# Patient Record
Sex: Female | Born: 1990 | Race: White | Hispanic: No | Marital: Single | Smoking: Former smoker
Health system: Southern US, Community
[De-identification: ages and names within clinical notes are randomized; demographics above are authoritative.]

## PROBLEM LIST (undated history)

## (undated) DIAGNOSIS — A64 Unspecified sexually transmitted disease: Secondary | ICD-10-CM

## (undated) DIAGNOSIS — G4733 Obstructive sleep apnea (adult) (pediatric): Secondary | ICD-10-CM

## (undated) DIAGNOSIS — F329 Major depressive disorder, single episode, unspecified: Secondary | ICD-10-CM

## (undated) DIAGNOSIS — IMO0002 Reserved for concepts with insufficient information to code with codable children: Secondary | ICD-10-CM

## (undated) DIAGNOSIS — B009 Herpesviral infection, unspecified: Secondary | ICD-10-CM

## (undated) DIAGNOSIS — F32A Depression, unspecified: Secondary | ICD-10-CM

## (undated) HISTORY — DX: Depression, unspecified: F32.A

## (undated) HISTORY — PX: OTHER SURGICAL HISTORY: SHX169

## (undated) HISTORY — DX: Reserved for concepts with insufficient information to code with codable children: IMO0002

## (undated) HISTORY — DX: Herpesviral infection, unspecified: B00.9

## (undated) HISTORY — DX: Unspecified sexually transmitted disease: A64

## (undated) HISTORY — PX: WISDOM TOOTH EXTRACTION: SHX21

## (undated) HISTORY — DX: Major depressive disorder, single episode, unspecified: F32.9

## (undated) HISTORY — DX: Obstructive sleep apnea (adult) (pediatric): G47.33

---
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
## 2008-03-30 DIAGNOSIS — B009 Herpesviral infection, unspecified: Secondary | ICD-10-CM

## 2008-03-30 HISTORY — DX: Herpesviral infection, unspecified: B00.9

## 2008-04-17 ENCOUNTER — Encounter: Admission: RE | Admit: 2008-04-17 | Discharge: 2008-04-17 | Payer: Self-pay | Admitting: Obstetrics and Gynecology

## 2008-04-21 ENCOUNTER — Other Ambulatory Visit: Admission: RE | Admit: 2008-04-21 | Discharge: 2008-04-21 | Payer: Self-pay | Admitting: Obstetrics and Gynecology

## 2008-12-10 ENCOUNTER — Other Ambulatory Visit: Admission: RE | Admit: 2008-12-10 | Discharge: 2008-12-10 | Payer: Self-pay | Admitting: Family Medicine

## 2009-04-27 DIAGNOSIS — IMO0002 Reserved for concepts with insufficient information to code with codable children: Secondary | ICD-10-CM

## 2009-04-27 HISTORY — DX: Reserved for concepts with insufficient information to code with codable children: IMO0002

## 2009-07-19 ENCOUNTER — Encounter: Admission: RE | Admit: 2009-07-19 | Discharge: 2009-07-19 | Payer: Self-pay | Admitting: Obstetrics and Gynecology

## 2010-05-29 DIAGNOSIS — IMO0002 Reserved for concepts with insufficient information to code with codable children: Secondary | ICD-10-CM

## 2010-05-29 DIAGNOSIS — R87612 Low grade squamous intraepithelial lesion on cytologic smear of cervix (LGSIL): Secondary | ICD-10-CM

## 2010-05-29 HISTORY — DX: Low grade squamous intraepithelial lesion on cytologic smear of cervix (LGSIL): R87.612

## 2010-05-29 HISTORY — DX: Reserved for concepts with insufficient information to code with codable children: IMO0002

## 2010-08-08 ENCOUNTER — Encounter: Payer: Self-pay | Admitting: Family Medicine

## 2010-08-28 HISTORY — PX: COLPOSCOPY: SHX161

## 2012-04-22 ENCOUNTER — Other Ambulatory Visit: Payer: Self-pay | Admitting: Internal Medicine

## 2012-04-22 DIAGNOSIS — B1920 Unspecified viral hepatitis C without hepatic coma: Secondary | ICD-10-CM

## 2012-04-30 ENCOUNTER — Other Ambulatory Visit: Payer: Self-pay

## 2012-05-06 ENCOUNTER — Other Ambulatory Visit: Payer: Self-pay

## 2012-07-23 ENCOUNTER — Encounter: Payer: Self-pay | Admitting: Certified Nurse Midwife

## 2012-07-23 DIAGNOSIS — F329 Major depressive disorder, single episode, unspecified: Secondary | ICD-10-CM | POA: Insufficient documentation

## 2012-07-23 DIAGNOSIS — F32A Depression, unspecified: Secondary | ICD-10-CM | POA: Insufficient documentation

## 2012-07-26 ENCOUNTER — Encounter: Payer: Self-pay | Admitting: Gynecology

## 2012-07-26 ENCOUNTER — Ambulatory Visit (INDEPENDENT_AMBULATORY_CARE_PROVIDER_SITE_OTHER): Payer: 59 | Admitting: Gynecology

## 2012-07-26 VITALS — BP 100/60 | Ht 70.0 in | Wt 170.0 lb

## 2012-07-26 DIAGNOSIS — Z87410 Personal history of cervical dysplasia: Secondary | ICD-10-CM | POA: Insufficient documentation

## 2012-07-26 DIAGNOSIS — Z309 Encounter for contraceptive management, unspecified: Secondary | ICD-10-CM

## 2012-07-26 DIAGNOSIS — Z113 Encounter for screening for infections with a predominantly sexual mode of transmission: Secondary | ICD-10-CM

## 2012-07-26 DIAGNOSIS — Z124 Encounter for screening for malignant neoplasm of cervix: Secondary | ICD-10-CM

## 2012-07-26 DIAGNOSIS — Z0000 Encounter for general adult medical examination without abnormal findings: Secondary | ICD-10-CM

## 2012-07-26 DIAGNOSIS — Z87898 Personal history of other specified conditions: Secondary | ICD-10-CM

## 2012-07-26 DIAGNOSIS — Z8742 Personal history of other diseases of the female genital tract: Secondary | ICD-10-CM

## 2012-07-26 DIAGNOSIS — Z01419 Encounter for gynecological examination (general) (routine) without abnormal findings: Secondary | ICD-10-CM

## 2012-07-26 LAB — POCT URINALYSIS DIPSTICK
Urobilinogen, UA: NEGATIVE
pH, UA: 5

## 2012-07-26 MED ORDER — NORETHINDRONE ACET-ETHINYL EST 1.5-30 MG-MCG PO TABS: 1.0000 | ORAL_TABLET | Freq: Every day | ORAL | Status: DC

## 2012-07-26 NOTE — Progress Notes (Signed)
22 y.o.   Single    Caucasian   female   G0P0000   here for annual exam.  Not currently sexually active, broke up with long time boyfriend, had quick relation after, condoms. Wants cheaper oc.  Gardisil completed  LMP: 06/29/12    Sexually active: yes  The current method of family planning is NuvaRing  inserts.    Exercising:  Cardio 3-4x/wk Last mammogram:  none Last pap smear: 12/27/11  History of abnormal pap: ASCUS + HPV 12/27/11 Smoking: 3 cigs/qd Alcohol: 5 drinks/wk Last colonoscopy: none Last Bone Density:  none Last tetanus shot: 2004 Last cholesterol check: not sure BSE: yes  Hgb:                Urine: Leuks 1   No family history on file.  Patient Active Problem List   Diagnosis Date Noted  . Depression     Past Medical History  Diagnosis Date  . 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 Depression     Past Surgical History  Procedure Laterality Date  . Colposcopy  7/12    CIN I    Allergies: Review of patient's allergies indicates no known allergies.  Current Outpatient Prescriptions  Medication Sig Dispense Refill  . etonogestrel-ethinyl estradiol (NUVARING) 0.12-0.015 MG/24HR vaginal ring Place 1 each vaginally every 28 (twenty-eight) days. Insert vaginally and leave in place for 3 consecutive weeks, then remove for 1 week.      . Multiple Vitamins-Minerals (MULTIVITAMIN PO) Take by mouth.       No current facility-administered medications for this visit.    ROS: Pertinent items are noted in HPI.  Social Hx:    Exam:    BP 100/60   Wt Readings from Last 3 Encounters:  No data found for Wt     Ht Readings from Last 3 Encounters:  No data found for Ht    General appearance: alert, cooperative and appears stated age Head: Normocephalic, without obvious abnormality, atraumatic Neck: no adenopathy, supple, symmetrical, trachea midline and thyroid not  enlarged, symmetric, no tenderness/mass/nodules Lungs: clear to auscultation bilaterally Breasts: Inspection negative, No nipple retraction or dimpling, No nipple discharge or bleeding, No axillary or supraclavicular adenopathy, Normal to palpation without dominant masses Heart: regular rate and rhythm Abdomen: soft, non-tender; bowel sounds normal; no masses,  no organomegaly Extremities: extremities normal, atraumatic, no cyanosis or edema Skin: Skin color, texture, turgor normal. No rashes or lesions Lymph nodes: Cervical, supraclavicular, and axillary nodes normal. No abnormal inguinal nodes palpated Neurologic: Grossly normal   Pelvic: External genitalia:  no lesions              Urethra:  normal appearing urethra with no masses, tenderness or lesions              Bartholins and Skenes: normal                 Vagina: normal appearing vagina with normal color and discharge, no lesions              Cervix: normal appearance              Pap taken: yes        Bimanual Exam:  Uterus:  uterus is normal size, shape, consistency and nontender  Adnexa: normal adnexa in size, nontender and no masses                                      Rectovaginal: Confirms                                      Anus:  normal sphincter tone, no lesions  A: normal exam History of abnormal PAP     P: pap smear with hpv counseled on use and side effects of OCP's, discussed possible decreased bone formation with lower dose oc- will change to Loestrin 1.5/30, if equal cost, prefers switch back GC/CTM only requested condoms return annually or prn     An After Visit Summary was printed and given to the patient.

## 2012-07-29 ENCOUNTER — Telehealth: Payer: Self-pay | Admitting: *Deleted

## 2012-07-29 LAB — GC/CHLAMYDIA PROBE AMP, URINE
Chlamydia, Swab/Urine, PCR: NEGATIVE
GC Probe Amp, Urine: NEGATIVE

## 2012-07-29 NOTE — Telephone Encounter (Signed)
Left Message To Call Back  

## 2012-07-29 NOTE — Telephone Encounter (Signed)
Message copied by Lorraine Lax on Mon Jul 29, 2012  6:29 PM ------      Message from: Douglass Rivers      Created: Mon Jul 29, 2012  4:17 PM       Negative       ------

## 2012-07-31 LAB — IPS PAP TEST WITH HPV

## 2012-07-31 NOTE — Telephone Encounter (Signed)
Returning phone call. Will be in class at 12:15. Call at 2:30

## 2012-07-31 NOTE — Telephone Encounter (Signed)
Left Message To Call Back  

## 2012-08-06 NOTE — Telephone Encounter (Signed)
Pt. Notified 08/05/12 @ 11:31

## 2012-10-07 ENCOUNTER — Other Ambulatory Visit: Payer: Self-pay | Admitting: Gynecology

## 2012-10-07 ENCOUNTER — Telehealth: Payer: Self-pay | Admitting: Gynecology

## 2012-10-07 DIAGNOSIS — Z309 Encounter for contraceptive management, unspecified: Secondary | ICD-10-CM

## 2012-10-07 NOTE — Telephone Encounter (Signed)
Spoke with pt who is interested in a cheaper OCP. Saw TL 07-26-12 for Aex, was on Nuvaring. Switched to OCP due to cost, pt thinks it was Loestrin. Pt reports her sister is on Sprintec and it is very inexpensive. Pt wondering if that could be an option for her. Chart on your desk.

## 2012-10-07 NOTE — Telephone Encounter (Signed)
Cannot say, both are generic, I ordered 3 packs, so maybe that's why it seems expensive, no pharmacy benefits in [place] so can't tell, please have her check her benefits before we start changing pills

## 2012-10-07 NOTE — Telephone Encounter (Signed)
Was in to see Dr. Farrel Gobble at the end of May and wants to talk about birth control options.

## 2012-10-07 NOTE — Telephone Encounter (Signed)
LMTCB  aa 

## 2012-10-08 NOTE — Telephone Encounter (Signed)
Left message on CB# of Dr. Farrel Gobble response to change of OCP due to cost. Left message of need for patient to check her pharmacy benefits first to see what would benefit her of cost of OCP and let us know before changing Rx.

## 2012-10-11 NOTE — Telephone Encounter (Signed)
Left message on CB#VM of need to call our office concerning Rx.

## 2012-10-14 NOTE — Telephone Encounter (Signed)
Left message on CB# of need to return call concerning Rx of OCP.

## 2012-11-29 ENCOUNTER — Telehealth: Payer: Self-pay | Admitting: Gynecology

## 2012-11-29 ENCOUNTER — Other Ambulatory Visit: Payer: Self-pay | Admitting: Gynecology

## 2012-11-29 DIAGNOSIS — Z309 Encounter for contraceptive management, unspecified: Secondary | ICD-10-CM

## 2012-11-29 MED ORDER — ETONOGESTREL-ETHINYL ESTRADIOL 0.12-0.015 MG/24HR VA RING
VAGINAL_RING | VAGINAL | Status: DC
Start: 2012-11-29 — End: 2013-08-15

## 2012-11-29 NOTE — Telephone Encounter (Signed)
Yes, order dropped

## 2012-11-29 NOTE — Telephone Encounter (Signed)
Patient wants to switch her BC from the pill back to the neuva ring.

## 2012-11-29 NOTE — Telephone Encounter (Signed)
Message left to advise "The Prescription that you have requested was sent to your pharmacy if you have any questions,  return call to Peityn Payton at [phone]."  

## 2012-11-29 NOTE — Telephone Encounter (Signed)
Dr. Farrel Gobble okay to switch patient back to Nuvaring? Order pended.  Last exam 06/2012, previously on Nuvaring.

## 2013-08-01 ENCOUNTER — Ambulatory Visit: Payer: 59 | Admitting: Gynecology

## 2013-08-15 ENCOUNTER — Encounter: Payer: Self-pay | Admitting: Gynecology

## 2013-08-15 ENCOUNTER — Ambulatory Visit (INDEPENDENT_AMBULATORY_CARE_PROVIDER_SITE_OTHER): Payer: 59 | Admitting: Gynecology

## 2013-08-15 VITALS — BP 122/60 | HR 68 | Ht 71.0 in | Wt 194.0 lb

## 2013-08-15 DIAGNOSIS — Z0000 Encounter for general adult medical examination without abnormal findings: Secondary | ICD-10-CM

## 2013-08-15 DIAGNOSIS — Z113 Encounter for screening for infections with a predominantly sexual mode of transmission: Secondary | ICD-10-CM

## 2013-08-15 DIAGNOSIS — Z3041 Encounter for surveillance of contraceptive pills: Secondary | ICD-10-CM

## 2013-08-15 DIAGNOSIS — Z01419 Encounter for gynecological examination (general) (routine) without abnormal findings: Secondary | ICD-10-CM

## 2013-08-15 DIAGNOSIS — N760 Acute vaginitis: Secondary | ICD-10-CM

## 2013-08-15 DIAGNOSIS — Z124 Encounter for screening for malignant neoplasm of cervix: Secondary | ICD-10-CM

## 2013-08-15 LAB — POCT URINALYSIS DIPSTICK
Bilirubin, UA: NEGATIVE
Glucose, UA: NEGATIVE
Ketones, UA: NEGATIVE
Nitrite, UA: NEGATIVE
Protein, UA: NEGATIVE
Urobilinogen, UA: NEGATIVE
pH, UA: 6

## 2013-08-15 LAB — POCT WET PREP (WET MOUNT): Clue Cells Wet Prep Whiff POC: POSITIVE

## 2013-08-15 LAB — HEMOGLOBIN, FINGERSTICK: Hemoglobin, fingerstick: 13.4 g/dL (ref 12.0–16.0)

## 2013-08-15 MED ORDER — ETONOGESTREL-ETHINYL ESTRADIOL 0.12-0.015 MG/24HR VA RING
VAGINAL_RING | VAGINAL | Status: DC
Start: 2013-08-15 — End: 2014-12-09

## 2013-08-15 NOTE — Progress Notes (Signed)
Patient ID: Monica Bridges, female   DOB: 03/13/1990, 23 y.o.   MRN: [id number] 23 y.o. Single Caucasian female   G0P0000 here for annual exam. Pt is currently sexually active.  She reports using condoms on a regular basis.  First sexual activity at 23 years old, unsure number of lifetime partners.     Patient's last menstrual period was 07/16/2013.          Sexually active: yes  The current method of family planning is NuvaRing vaginal inserts and condoms most of the time.    Exercising: yes  Home exercise routine includes yoga and light cardio and strength training about 3 times per week.. Last pap:  07/26/12, ASCUS, pos HR HPV Alcohol:  About 20 drinks per week Tobacco:  1-2 packs per week Drugs:  occassional marijuana Gardisil: yes, completed:  2007 Labs:  HB:  13.4  Urine:  1+ leuks, trace RBC  Health Maintenance  Topic Date Due  . Tetanus/tdap  11/28/2007  . Influenza Vaccine  09/27/2013  . Pap Smear  07/27/2015    No family history on file.  Patient Active Problem List   Diagnosis Date Noted  . History of abnormal Pap smear 07/26/2012  . Depression     Past Medical History  Diagnosis Date  . 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 Depression     Past Surgical History  Procedure Laterality Date  . Colposcopy  7/12    CIN I    Allergies: Review of patient's allergies indicates no known allergies.  Current Outpatient Prescriptions  Medication Sig Dispense Refill  . etonogestrel-ethinyl estradiol (NUVARING) 0.12-0.015 MG/24HR vaginal ring Insert vaginally and leave in place for 4 consecutive weeks, then remove for 3d  1 each  11  . Multiple Vitamins-Minerals (MULTIVITAMIN PO) Take by mouth.       No current facility-administered medications for this visit.    ROS: Pertinent items are noted in HPI.  Exam:    BP 122/60  Pulse 68  Ht 5\' 11"  (1.803 m)  Wt 194 lb  (87.998 kg)  BMI 27.07 kg/m2  LMP 07/16/2013 Weight change: @WEIGHTCHANGE @ Last 3 height recordings:  Ht Readings from Last 3 Encounters:  08/15/13 5\' 11"  (1.803 m)  07/26/12 5\' 10"  (1.778 m)   General appearance: alert, cooperative and appears stated age Head: Normocephalic, without obvious abnormality, atraumatic Neck: no adenopathy, no carotid bruit, no JVD, supple, symmetrical, trachea midline and thyroid not enlarged, symmetric, no tenderness/mass/nodules Lungs: clear to auscultation bilaterally Breasts: normal appearance, no masses or tenderness Heart: regular rate and rhythm, S1, S2 normal, no murmur, click, rub or gallop Abdomen: soft, non-tender; bowel sounds normal; no masses,  no organomegaly Extremities: extremities normal, atraumatic, no cyanosis or edema Skin: Skin color, texture, turgor normal. No rashes or lesions Lymph nodes: Cervical, supraclavicular, and axillary nodes normal. no inguinal nodes palpated Neurologic: Grossly normal   Pelvic: External genitalia:  no lesions              Urethra: normal appearing urethra with no masses, tenderness or lesions              Bartholins and Skenes: Bartholin's, Urethra, Skene's normal                 Vagina: normal appearing vagina with normal color and discharge, no lesions, vaginal discharge - white, frothy and thin  Cervix: normal appearance and friable              Pap taken: yes        Bimanual Exam:  Uterus:  uterus is normal size, shape, consistency and nontender                                      Adnexa:    normal adnexa in size, nontender and no masses                                      Rectovaginal: Confirms                                      Anus:  normal sphincter tone, no lesions  1. Routine gynecological examination  counseled on breast self exam, adequate intake of calcium and vitamin D, diet and exercise return annually or prn Discussed STD prevention, regular condom use.   2.  Laboratory examination ordered as part of a routine general medical examination  - POCT urinalysis dipstick - Hemoglobin, fingerstick  3. Encounter for surveillance of contraceptive pills  - etonogestrel-ethinyl estradiol (NUVARING) 0.12-0.015 MG/24HR vaginal ring; Insert vaginally and leave in place for 4 consecutive weeks, then remove for 3d  Dispense: 3 each; Refill: 3  4. Screening for cervical cancer  - PAP with Reflex to HPV (IPS)  5. Screen for STD (sexually transmitted disease)  - N. gonorrhoea and Chlamydia by PCR (IPS) - RPR - HIV antibody - POCT Wet Prep [employer]([place])  6. Vaginitis wp non-diagnostic - POCT Wet Prep [facility]([place])     An After Visit Summary was printed and given to the patient.

## 2013-08-16 LAB — RPR

## 2013-08-16 LAB — HIV ANTIBODY (ROUTINE TESTING W REFLEX): HIV 1&2 Ab, 4th Generation: NONREACTIVE

## 2013-08-19 LAB — IPS N GONORRHOEA AND CHLAMYDIA BY PCR

## 2013-08-20 ENCOUNTER — Telehealth: Payer: Self-pay | Admitting: *Deleted

## 2013-08-20 NOTE — Telephone Encounter (Signed)
Left Message To Call Back  

## 2013-08-20 NOTE — Telephone Encounter (Signed)
Message copied by Lorraine LaxSHAW, JASMINE J on Wed Aug 20, 2013  1:02 PM ------      Message from: Douglass RiversLATHROP, TRACY      Created: Wed Aug 20, 2013 12:57 PM       Inform GC/CTM both negative ------

## 2013-08-21 LAB — IPS PAP TEST WITH REFLEX TO HPV

## 2013-08-21 NOTE — Telephone Encounter (Signed)
Pt returning call. Pt says she is working all day tomorrow and would like a call back from 12-1 if possible.

## 2013-08-22 ENCOUNTER — Other Ambulatory Visit: Payer: Self-pay | Admitting: Gynecology

## 2013-08-22 DIAGNOSIS — N760 Acute vaginitis: Secondary | ICD-10-CM

## 2013-08-22 DIAGNOSIS — B9689 Other specified bacterial agents as the cause of diseases classified elsewhere: Secondary | ICD-10-CM

## 2013-08-22 DIAGNOSIS — IMO0002 Reserved for concepts with insufficient information to code with codable children: Secondary | ICD-10-CM

## 2013-08-22 MED ORDER — TINIDAZOLE 500 MG PO TABS: 2.0000 g | ORAL_TABLET | Freq: Once | ORAL | Status: DC

## 2013-08-22 NOTE — Telephone Encounter (Signed)
Results given as seen below. Patient agreeable and verbalizes understanding. Patient would like to call on lunch break to scheduled colpo. Patient currently using nuvaring for contraception.  Notes Recorded by Reggy EyeJasmine Shaw, CMA on 08/22/2013 at 9:48 AM Patient notified of GC/CHL results was then [facility] to [facility], RN for papsmear results. ------  Notes Recorded by Bennye Almracy H Lathrop, MD on 08/22/2013 at 9:02 AM Inform ASCUS, +HPV, will need colpo, order dropped, also wet prep non-diagnostic but pap suggestive of BV, would treat due tindamax ------

## 2013-08-22 NOTE — Telephone Encounter (Signed)
Patient notified see result note 

## 2013-08-26 ENCOUNTER — Telehealth: Payer: Self-pay

## 2013-08-26 DIAGNOSIS — B9689 Other specified bacterial agents as the cause of diseases classified elsewhere: Secondary | ICD-10-CM

## 2013-08-26 DIAGNOSIS — N760 Acute vaginitis: Secondary | ICD-10-CM

## 2013-08-26 MED ORDER — TINIDAZOLE 500 MG PO TABS: 2.0000 g | ORAL_TABLET | Freq: Once | ORAL | Status: DC

## 2013-08-26 NOTE — Telephone Encounter (Signed)
Spoke with pt. Pt insisted on the [facility] on the [address] of [address]. Rx sent.  Encounter closed

## 2013-08-26 NOTE — Telephone Encounter (Signed)
Received a fax from Pharm asking to clarify Rx. Spoke with Dr. Farrel GobbleLathrop and was told the Rx has to sent to a local pharm. Will call pt to find out what pharm does she want to use.

## 2013-08-27 ENCOUNTER — Telehealth: Payer: Self-pay

## 2013-08-27 DIAGNOSIS — B9689 Other specified bacterial agents as the cause of diseases classified elsewhere: Secondary | ICD-10-CM

## 2013-08-27 DIAGNOSIS — N760 Acute vaginitis: Secondary | ICD-10-CM

## 2013-08-27 NOTE — Telephone Encounter (Signed)
Spoke with Lowella Dandylare from [facility] informing her that the rx was cancelled and sent to a local pharm on request from Dr. Farrel GobbleLathrop. Spoke with pt yesterday and [facility]   Encounter closed

## 2013-08-28 ENCOUNTER — Telehealth: Payer: Self-pay | Admitting: Gynecology

## 2013-08-28 ENCOUNTER — Telehealth: Payer: Self-pay

## 2013-08-28 NOTE — Progress Notes (Signed)
See phone note from 08-22-13. Encounter closed.

## 2013-08-28 NOTE — Telephone Encounter (Signed)
Left message for patient to call back. Need to go over oop for procedure scheduled 07.07.2015.

## 2013-08-28 NOTE — Telephone Encounter (Signed)
Spoke with patient. Colpo scheduled for 7/7 at 1:00pm with Dr.Lathrop. Patient is currently using Nuvaring for contraception. Patient just finished cycle and placed ring on July 1st. Patient is due to take ring out again on 7/28. Colposcopy pre-procedure instructions given. Motrin instructions given. Motrin=Advil=Ibuprofen Can take 800 mg (Can purchase over the counter, you will need four 200 mg pills) every 8 hours as needed.  Take with food. Make sure to eat a meal before appointment and drink plenty of fluids. Patient verbalized understanding and will call to reschedule if will be on menses or has any concerns regarding pregnancy. Advised will need to cancel within 24 hours or will have $100.00 no show fee placed to account. Patient agreeable and verbalizes understanding.  Routing to provider for final review. Patient agreeable to disposition. Will close encounter

## 2013-08-28 NOTE — Telephone Encounter (Signed)
Left message to call Monica Bridges at [phone].  Patient is aware of pap results and that she needs colpo. Patient was to call to schedule appointment.Patient needs colpo scheduled for ASCUS pap with positive high risk HPV. Patient currently using Nuvaring for contraception.

## 2013-09-01 NOTE — Telephone Encounter (Signed)
Reviewed with Dr. Farrel GobbleLathrop, okay to keep scheduled appointment tomorrow for Colposcopy. Call if bleeding worsens.  Patient agreeable.   Routing to provider for final review. Patient agreeable to disposition. Will close encounter

## 2013-09-01 NOTE — Telephone Encounter (Signed)
Patient is scheduled for colpo tomorrow but is spotting is this okay.

## 2013-09-02 ENCOUNTER — Encounter: Payer: Self-pay | Admitting: Gynecology

## 2013-09-02 ENCOUNTER — Ambulatory Visit (INDEPENDENT_AMBULATORY_CARE_PROVIDER_SITE_OTHER): Payer: 59 | Admitting: Gynecology

## 2013-09-02 VITALS — BP 118/62 | Resp 18 | Ht 71.0 in | Wt 193.0 lb

## 2013-09-02 DIAGNOSIS — R6889 Other general symptoms and signs: Secondary | ICD-10-CM

## 2013-09-02 DIAGNOSIS — IMO0002 Reserved for concepts with insufficient information to code with codable children: Secondary | ICD-10-CM

## 2013-09-02 NOTE — Patient Instructions (Signed)

## 2013-09-02 NOTE — Progress Notes (Addendum)
Patient ID: Monica Bridges, female   DOB: 12/26/1990, 23 y.o.   MRN: [id number]  No chief complaint on file.   HPI Monica Bridges is a 23 y.o. female.  Sexually active, new partner few months, mostly uses condoms HPI  Indications: Pap smear on June 2015 showed: ASCUS with POSITIVE high risk HPV. Previous colposcopy: NONE. Prior cervical treatment: no treatment.Procedure explained and patient's questions were invited and answered.  Consent form signed.    Role of HPV in genesis of SIL discussed with patient, and questions answered.     Past Medical History  Diagnosis Date  . 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 Depression     Past Surgical History  Procedure Laterality Date  . Colposcopy  7/12    CIN I    No family history on file.  Social History History  Substance Use Topics  . Smoking status: Current Every Day Smoker -- 0.25 packs/day    Types: Cigarettes  . Smokeless tobacco: Never Used  . Alcohol Use: 10.0 oz/week    20 drink(s) per week    No Known Allergies  Current Outpatient Prescriptions  Medication Sig Dispense Refill  . etonogestrel-ethinyl estradiol (NUVARING) 0.12-0.015 MG/24HR vaginal ring Insert vaginally and leave in place for 4 consecutive weeks, then remove for 3d  3 each  3  . Multiple Vitamins-Minerals (MULTIVITAMIN PO) Take by mouth.      . tinidazole (TINDAMAX) 500 MG tablet Take 4 tablets (2,000 mg total) by mouth once. 4 tablets at once for 2d  8 tablet  0   No current facility-administered medications for this visit.    Review of Systems Review of Systems  Last menstrual period 07/16/2013.  Physical Exam Physical Exam  Constitutional: She appears well-developed and well-nourished.  Genitourinary: Vagina normal. No vaginal discharge found.      Data Reviewed   Assessment    Procedure Details  The risks and benefits of  the procedure and Written informed consent obtained.  Marland Kitchen.Speculum inserted atraumatically and cervix visualized.  3% acetic acid applied.  Cervix examined using 3.75 and 7.5 and 15   X magnification and green filter.    Gross appearance:normal  Squamocolumnar junction seen in entirety: yes   no mosaicism, no punctation, no abnormal vasculature and acetowhite lesion(s) noted at 1-3, 4 and 8-10 o'clock  cervix swabbed with Lugol's solution, endocervical speculum placed, SCJ visualized - lesion at 2,4,10 o'clock, endocervical lesion noted at none , cervical biopsies taken at 2,4,10 o'clock, specimen labelled and sent to pathology and hemostasis achieved with Monsel's solution  Extent of lesion entirely seen: yes    Specimens: 4,10,2  Complications: none.     Plan    Specimens labelled and sent to Pathology. Triage based on results      Josue Kass H 09/02/2013, 12:35 PM    Patient tolerated procedure well.    Post biopsy instructions and AVS given to patient.    (A.) CERVIX, "4:00", BIOPSY: -BENIGN ENDOCERVICAL MUCOSA -NEGATIVE FOR ATYPIA OR MALIGNANCY -NO SQUAMOUS MUCOSA PRESENT FOR EVALUATION (B.) CERVIX, "10:00", BIOPSY: -BENIGN ENDOCERVICAL MUCOSA WITH CHRONIC INFLAMMATION -NEGATIVE FOR ATYPIA OR MALIGNANCY -NO SQUAMOUS MUCOSA PRESENT FOR EVALUATION (C.) CERVIX, "2:00", BIOPSY: -FOCAL LOW GRADE SQUAMOUS INTRAEPITHELIAL LESION WITH SUPERFICIAL ENDOCERVICAL GLAND INVOLVEMENT  Recall 8

## 2013-09-04 LAB — IPS OTHER TISSUE BIOPSY

## 2014-08-17 ENCOUNTER — Ambulatory Visit: Payer: 59 | Admitting: Gynecology

## 2014-08-24 ENCOUNTER — Telehealth: Payer: Self-pay | Admitting: *Deleted

## 2014-08-24 NOTE — Telephone Encounter (Signed)
Left message for patient to call the office. We have a faxes refill request for her birth control, she is past due for her AEX-needs apt. -eh

## 2014-08-26 ENCOUNTER — Telehealth: Payer: Self-pay | Admitting: *Deleted

## 2014-08-26 DIAGNOSIS — Z3041 Encounter for surveillance of contraceptive pills: Secondary | ICD-10-CM

## 2014-08-26 NOTE — Telephone Encounter (Signed)
[facility] for Nuvaring Last AEX:  08/15/13 with TL Next AEX: NO aex scheduled for 2016 Last MMG (if hormonal medication request): n/a  Refill authorized: ?  Left Message To Call Back

## 2014-08-28 NOTE — Telephone Encounter (Signed)
Called patient and scheduled her for AEX with PG 09/04/14. Asked patient if she needed refill and she said that she wants to hold off on that until she sees Ms. Patty she wants to discuss that with her along with other things when she comes in for AEX.   Rx denied for now.  Routed to provider for review, encounter closed.

## 2014-08-28 NOTE — Telephone Encounter (Signed)
Patient returning call.

## 2014-09-02 ENCOUNTER — Encounter: Payer: Self-pay | Admitting: Nurse Practitioner

## 2014-09-02 ENCOUNTER — Ambulatory Visit (INDEPENDENT_AMBULATORY_CARE_PROVIDER_SITE_OTHER): Payer: 59 | Admitting: Nurse Practitioner

## 2014-09-02 VITALS — BP 96/60 | HR 80 | Resp 16 | Ht 70.0 in | Wt 194.0 lb

## 2014-09-02 DIAGNOSIS — Z113 Encounter for screening for infections with a predominantly sexual mode of transmission: Secondary | ICD-10-CM

## 2014-09-02 DIAGNOSIS — Z01419 Encounter for gynecological examination (general) (routine) without abnormal findings: Secondary | ICD-10-CM | POA: Diagnosis not present

## 2014-09-02 DIAGNOSIS — Z0000 Encounter for general adult medical examination without abnormal findings: Secondary | ICD-10-CM

## 2014-09-02 DIAGNOSIS — Z309 Encounter for contraceptive management, unspecified: Secondary | ICD-10-CM

## 2014-09-02 DIAGNOSIS — Z3009 Encounter for other general counseling and advice on contraception: Secondary | ICD-10-CM

## 2014-09-02 LAB — POCT URINALYSIS DIPSTICK
Bilirubin, UA: NEGATIVE
Glucose, UA: NEGATIVE
Ketones, UA: NEGATIVE
Nitrite, UA: NEGATIVE
Protein, UA: NEGATIVE
Urobilinogen, UA: NEGATIVE
pH, UA: 5

## 2014-09-02 NOTE — Patient Instructions (Signed)
General topics  Next pap or exam is  due in 1 year Take a Women's multivitamin Take 1200 mg. of calcium daily - prefer dietary If any concerns in interim to call back  Breast Self-Awareness Practicing breast self-awareness may pick up problems early, prevent significant medical complications, and possibly save your life. By practicing breast self-awareness, you can become familiar with how your breasts look and feel and if your breasts are changing. This allows you to notice changes early. It can also offer you some reassurance that your breast health is good. One way to learn what is normal for your breasts and whether your breasts are changing is to do a breast self-exam. If you find a lump or something that was not present in the past, it is best to contact your caregiver right away. Other findings that should be evaluated by your caregiver include nipple discharge, especially if it is bloody; skin changes or reddening; areas where the skin seems to be pulled in (retracted); or new lumps and bumps. Breast pain is seldom associated with cancer (malignancy), but should also be evaluated by a caregiver. BREAST SELF-EXAM The best time to examine your breasts is 5 7 days after your menstrual period is over.  ExitCare Patient Information 2013 ExitCare, LLC.   Exercise to Stay Healthy Exercise helps you become and stay healthy. EXERCISE IDEAS AND TIPS Choose exercises that:  You enjoy.  Fit into your day. You do not need to exercise really hard to be healthy. You can do exercises at a slow or medium level and stay healthy. You can:  Stretch before and after working out.  Try yoga, Pilates, or tai chi.  Lift weights.  Walk fast, swim, jog, run, climb stairs, bicycle, dance, or rollerskate.  Take aerobic classes. Exercises that burn about 150 calories:  Running 1  miles in 15 minutes.  Playing volleyball for 45 to 60 minutes.  Washing and waxing a car for 45 to 60  minutes.  Playing touch football for 45 minutes.  Walking 1  miles in 35 minutes.  Pushing a stroller 1  miles in 30 minutes.  Playing basketball for 30 minutes.  Raking leaves for 30 minutes.  Bicycling 5 miles in 30 minutes.  Walking 2 miles in 30 minutes.  Dancing for 30 minutes.  Shoveling snow for 15 minutes.  Swimming laps for 20 minutes.  Walking up stairs for 15 minutes.  Bicycling 4 miles in 15 minutes.  Gardening for 30 to 45 minutes.  Jumping rope for 15 minutes.  Washing windows or floors for 45 to 60 minutes. Document Released: 03/18/2010 Document Revised: 05/08/2011 Document Reviewed: 03/18/2010 ExitCare Patient Information 2013 ExitCare[employer] LLC.   Other topics ( that may be useful information):    Sexually Transmitted Disease Sexually transmitted disease (STD) refers to any infection that is passed from person to person during sexual activity. This may happen by way of saliva, semen, blood, vaginal mucus, or urine. Common STDs include:  Gonorrhea.  Chlamydia.  Syphilis.  HIV/AIDS.  Genital herpes.  Hepatitis B and C.  Trichomonas.  Human papillomavirus (HPV).  Pubic lice. CAUSES  An STD may be spread by bacteria, virus, or parasite. A person can get an STD by:  Sexual intercourse with an infected person.  Sharing sex toys with an infected person.  Sharing needles with an infected person.  Having intimate contact with the genitals, mouth, or rectal areas of an infected person. SYMPTOMS  Some people may not have any symptoms, but   they can still pass the infection to others. Different STDs have different symptoms. Symptoms include:  Painful or bloody urination.  Pain in the pelvis, abdomen, vagina, anus, throat, or eyes.  Skin rash, itching, irritation, growths, or sores (lesions). These usually occur in the genital or anal area.  Abnormal vaginal discharge.  Penile discharge in men.  Soft, flesh-colored skin growths in the  genital or anal area.  Fever.  Pain or bleeding during sexual intercourse.  Swollen glands in the groin area.  Yellow skin and eyes (jaundice). This is seen with hepatitis. DIAGNOSIS  To make a diagnosis, your caregiver may:  Take a medical history.  Perform a physical exam.  Take a specimen (culture) to be examined.  Examine a sample of discharge under a microscope.  Perform blood test TREATMENT   Chlamydia, gonorrhea, trichomonas, and syphilis can be cured with antibiotic medicine.  Genital herpes, hepatitis, and HIV can be treated, but not cured, with prescribed medicines. The medicines will lessen the symptoms.  Genital warts from HPV can be treated with medicine or by freezing, burning (electrocautery), or surgery. Warts may come back.  HPV is a virus and cannot be cured with medicine or surgery.However, abnormal areas may be followed very closely by your caregiver and may be removed from the cervix, vagina, or vulva through office procedures or surgery. If your diagnosis is confirmed, your recent sexual partners need treatment. This is true even if they are symptom-free or have a negative culture or evaluation. They should not have sex until their caregiver says it is okay. HOME CARE INSTRUCTIONS  All sexual partners should be informed, tested, and treated for all STDs.  Take your antibiotics as directed. Finish them even if you start to feel better.  Only take over-the-counter or prescription medicines for pain, discomfort, or fever as directed by your caregiver.  Rest.  Eat a balanced diet and drink enough fluids to keep your urine clear or pale yellow.  Do not have sex until treatment is completed and you have followed up with your caregiver. STDs should be checked after treatment.  Keep all follow-up appointments, Pap tests, and blood tests as directed by your caregiver.  Only use latex condoms and water-soluble lubricants during sexual activity. Do not use  petroleum jelly or oils.  Avoid alcohol and illegal drugs.  Get vaccinated for HPV and hepatitis. If you have not received these vaccines in the past, talk to your caregiver about whether one or both might be right for you.  Avoid risky sex practices that can break the skin. The only way to avoid getting an STD is to avoid all sexual activity.Latex condoms and dental dams (for oral sex) will help lessen the risk of getting an STD, but will not completely eliminate the risk. SEEK MEDICAL CARE IF:   You have a fever.  You have any new or worsening symptoms. Document Released: 05/06/2002 Document Revised: 05/08/2011 Document Reviewed: 05/13/2010 [facility] Patient Information 2013 [place].    Domestic Abuse You are being battered or abused if someone close to you hits, pushes, or physically hurts you in any way. You also are being abused if you are forced into activities. You are being sexually abused if you are forced to have sexual contact of any kind. You are being emotionally abused if you are made to feel worthless or if you are constantly threatened. It is important to remember that help is available. No one has the right to abuse you. PREVENTION OF FURTHER  ABUSE  Learn the warning signs of danger. This varies with situations but may include: the use of alcohol, threats, isolation from friends and family, or forced sexual contact. Leave if you feel that violence is going to occur.  If you are attacked or beaten, report it to the police so the abuse is documented. You do not have to press charges. The police can protect you while you or the attackers are leaving. Get the officer's name and badge number and a copy of the report.  Find someone you can trust and tell them what is happening to you: your caregiver, a nurse, clergy member, close friend or family member. Feeling ashamed is natural, but remember that you have done nothing wrong. No one deserves abuse. Document Released:  02/11/2000 Document Revised: 05/08/2011 Document Reviewed: 04/21/2010 ExitCare Patient Information 2013 ExitCare[employer] LLC.    How Much is Too Much Alcohol? Drinking too much alcohol can cause injury, accidents, and health problems. These types of problems can include:   Car crashes.  Falls.  Family fighting (domestic violence).  Drowning.  Fights.  Injuries.  Burns.  Damage to certain organs.  Having a baby with birth defects. ONE DRINK CAN BE TOO MUCH WHEN YOU ARE:  Working.  Pregnant or breastfeeding.  Taking medicines. Ask your doctor.  Driving or planning to drive. If you or someone you know has a drinking problem, get help from a doctor.  Document Released: 12/10/2008 Document Revised: 05/08/2011 Document Reviewed: 12/10/2008 ExitCare Patient Information 2013 ExitCare[employer] LLC.   Smoking Hazards Smoking cigarettes is extremely bad for your health. Tobacco smoke has over 200 known poisons in it. There are over 60 chemicals in tobacco smoke that cause cancer. Some of the chemicals found in cigarette smoke include:   Cyanide.  Benzene.  Formaldehyde.  Methanol (wood alcohol).  Acetylene (fuel used in welding torches).  Ammonia. Cigarette smoke also contains the poisonous gases nitrogen oxide and carbon monoxide.  Cigarette smokers have an increased risk of many serious medical problems and Smoking causes approximately:  90% of all lung cancer deaths in men.  80% of all lung cancer deaths in women.  90% of deaths from chronic obstructive lung disease. Compared with nonsmokers, smoking increases the risk of:  Coronary heart disease by 2 to 4 times.  Stroke by 2 to 4 times.  Men developing lung cancer by 23 times.  Women developing lung cancer by 13 times.  Dying from chronic obstructive lung diseases by 12 times.  . Smoking is the most preventable cause of death and disease in our society.  WHY IS SMOKING ADDICTIVE?  Nicotine is the chemical  agent in tobacco that is capable of causing addiction or dependence.  When you smoke and inhale, nicotine is absorbed rapidly into the bloodstream through your lungs. Nicotine absorbed through the lungs is capable of creating a powerful addiction. Both inhaled and non-inhaled nicotine may be addictive.  Addiction studies of cigarettes and spit tobacco show that addiction to nicotine occurs mainly during the teen years, when young people begin using tobacco products. WHAT ARE THE BENEFITS OF QUITTING?  There are many health benefits to quitting smoking.   Likelihood of developing cancer and heart disease decreases. Health improvements are seen almost immediately.  Blood pressure, pulse rate, and breathing patterns start returning to normal soon after quitting. QUITTING SMOKING   American Lung Association - 1-800-LUNGUSA  American Cancer Society - 1-800-ACS-2345 Document Released: 03/23/2004 Document Revised: 05/08/2011 Document Reviewed: 11/25/2008 ExitCare Patient Information 2013 ExitCare,   LLC.   Stress Management Stress is a state of physical or mental tension that often results from changes in your life or normal routine. Some common causes of stress are:  Death of a loved one.  Injuries or severe illnesses.  Getting fired or changing jobs.  Moving into a new home. Other causes may be:  Sexual problems.  Business or financial losses.  Taking on a large debt.  Regular conflict with someone at home or at work.  Constant tiredness from lack of sleep. It is not just bad things that are stressful. It may be stressful to:  Win the lottery.  Get married.  Buy a new car. The amount of stress that can be easily tolerated varies from person to person. Changes generally cause stress, regardless of the types of change. Too much stress can affect your health. It may lead to physical or emotional problems. Too little stress (boredom) may also become stressful. SUGGESTIONS TO  REDUCE STRESS:  Talk things over with your family and friends. It often is helpful to share your concerns and worries. If you feel your problem is serious, you may want to get help from a professional counselor.  Consider your problems one at a time instead of lumping them all together. Trying to take care of everything at once may seem impossible. List all the things you need to do and then start with the most important one. Set a goal to accomplish 2 or 3 things each day. If you expect to do too many in a single day you will naturally fail, causing you to feel even more stressed.  Do not use alcohol or drugs to relieve stress. Although you may feel better for a short time, they do not remove the problems that caused the stress. They can also be habit forming.  Exercise regularly - at least 3 times per week. Physical exercise can help to relieve that "uptight" feeling and will relax you.  The shortest distance between despair and hope is often a good night's sleep.  Go to bed and get up on time allowing yourself time for appointments without being rushed.  Take a short "time-out" period from any stressful situation that occurs during the day. Close your eyes and take some deep breaths. Starting with the muscles in your face, tense them, hold it for a few seconds, then relax. Repeat this with the muscles in your neck, shoulders, hand, stomach, back and legs.  Take good care of yourself. Eat a balanced diet and get plenty of rest.  Schedule time for having fun. Take a break from your daily routine to relax. HOME CARE INSTRUCTIONS   Call if you feel overwhelmed by your problems and feel you can no longer manage them on your own.  Return immediately if you feel like hurting yourself or someone else. Document Released: 08/09/2000 Document Revised: 05/08/2011 Document Reviewed: 04/01/2007 ExitCare Patient Information 2013 ExitCare, LLC.  

## 2014-09-02 NOTE — Progress Notes (Signed)
24 y.o. G0 Single  Caucasian Fe here for annual exam.  Menses last 5-7 days on Nuva Ring.  She really would like another choice of birth control with school coming up.   Not SA in over a year. Still in school for undergrad -then applying this summer for med school - she wants to do Veterinarian medicine.  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  She has noted some vaginal discharge before menses started not sure if she has an infection.  She also wants to be checked for STD's.  Patient's last menstrual period was 08/26/2014 (approximate).          Sexually active: No.  The current method of family planning is Nuva Ring vaginal inserts.    Exercising: Yes.    Biking, walking Smoker:  yes  Health Maintenance: Pap:  08/15/13 ASCUS + HR HPV: detected. Colpo Biopsy CIN1 09/02/2013 TDaP:  2008 Gardasil 2007 Labs: Here today - STD Check UA: WBC=Trace, RBC=Large ( On Menses ) Hg: 13.2   reports that she has been smoking Cigarettes.  She has been smoking about 0.25 packs per day. She has never used smokeless tobacco. She reports that she drinks about 0.6 oz of alcohol per week. She reports that she uses illicit drugs (Marijuana).  Past Medical History  Diagnosis Date  . 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 Depression     Past Surgical History  Procedure Laterality Date  . Colposcopy  7/12    CIN I    Current Outpatient Prescriptions  Medication Sig Dispense Refill  . etonogestrel-ethinyl estradiol (NUVARING) 0.12-0.015 MG/24HR vaginal ring Insert vaginally and leave in place for 4 consecutive weeks, then remove for 3d 3 each 3  . Multiple Vitamins-Minerals (MULTIVITAMIN PO) Take by mouth.     No current facility-administered medications for this visit.    Family History  Problem Relation Age of Onset  . Cancer Maternal Aunt     Lung    ROS:  Pertinent items are noted in HPI.   Otherwise, a comprehensive ROS was negative.  Exam:   BP 96/60 mmHg  Pulse 80  Resp 16  Ht  (1.778 m)  Wt 194 lb (87.998 kg)  BMI 27.84 kg/m2  LMP 08/26/2014 (Approximate) Height:  (177.8 cm) Ht Readings from Last 3 Encounters:  09/02/14  (1.778 m)  09/02/13  (1.803 m)  08/15/13  (1.803 m)    General appearance: alert, cooperative and appears stated age Head: Normocephalic, without obvious abnormality, atraumatic Neck: no adenopathy, supple, symmetrical, trachea midline and thyroid normal to inspection and palpation Lungs: clear to auscultation bilaterally Breasts: normal appearance, no masses or tenderness Heart: regular rate and rhythm Abdomen: soft, non-tender; no masses,  no organomegaly Extremities: extremities normal, atraumatic, no cyanosis or edema Skin: Skin color, texture, turgor normal. No rashes or lesions Lymph nodes: Cervical, supraclavicular, and axillary nodes normal. No abnormal inguinal nodes palpated Neurologic: Grossly normal   Pelvic: External genitalia:  no lesions              Urethra:  normal appearing urethra with no masses, tenderness or lesions              Bartholin's and Skene's: normal                 Vagina: normal appearing vagina with normal color and  discharge, no lesions              Cervix: anteverted              Pap taken: Yes.   Bimanual Exam:  Uterus:  normal size, contour, position, consistency, mobility, non-tender              Adnexa: no mass, fullness, tenderness               Rectovaginal: Confirms               Anus:  normal sphincter tone, no lesions  Chaperone present: Yes  A:  Well Woman with normal exam  Contraception with Nuva Ring  History of ASCUS with HR HPV - colp Bx 09/02/2013 CIN I  Would like to consider Skyla IUD  R/O vaginitis  R/O STD's  P:   Reviewed health and wellness pertinent to exam  Pap smear as above  Will follow with STD's  She has a month of Nuva Ring left and will  hopefully get Skyla placement before then.  [facility] and she will need consult with MD  Counseled on breast self exam, use and side effects of OCP's, adequate intake of calcium and vitamin D, diet and exercise return annually or prn  An After Visit Summary was printed and given to the patient.

## 2014-09-03 ENCOUNTER — Other Ambulatory Visit: Payer: Self-pay | Admitting: Certified Nurse Midwife

## 2014-09-03 ENCOUNTER — Telehealth: Payer: Self-pay | Admitting: *Deleted

## 2014-09-03 DIAGNOSIS — N760 Acute vaginitis: Secondary | ICD-10-CM

## 2014-09-03 DIAGNOSIS — B9689 Other specified bacterial agents as the cause of diseases classified elsewhere: Secondary | ICD-10-CM

## 2014-09-03 LAB — STD PANEL
HIV 1&2 Ab, 4th Generation: NONREACTIVE
Hepatitis B Surface Ag: NEGATIVE

## 2014-09-03 LAB — WET PREP BY MOLECULAR PROBE
Candida species: NEGATIVE
Gardnerella vaginalis: POSITIVE — AB
Trichomonas vaginosis: NEGATIVE

## 2014-09-03 MED ORDER — HYLAFEM VA SUPP: 1.0000 | Freq: Every day | VAGINAL | Status: DC

## 2014-09-03 NOTE — Telephone Encounter (Signed)
Left Message To Call Back  

## 2014-09-03 NOTE — Telephone Encounter (Signed)
-----   Message from Verner Choleborah S Leonard, CNM sent at 09/03/2014 12:30 PM EDT ----- Notify patient that affirm is positive for BV, negative for yeast and Trichomonas [facility] may need [facility] order and that is Ok to wait to treat. Will use one suppository nightly x 3 nights . Symptoms should resolve within 2 weeks. HIV,RPR, Hep B negative GC,Chlamydia pending

## 2014-09-03 NOTE — Telephone Encounter (Signed)
Patient aware of results and to use Hylafem  1 suppository nightly x 3 nights. Aware someone will call her as soon as gc/ct comes in. [facility] is [facility].  Encounter closed.

## 2014-09-03 NOTE — Telephone Encounter (Signed)
Returning call.

## 2014-09-04 LAB — IPS N GONORRHOEA AND CHLAMYDIA BY PCR

## 2014-09-07 LAB — IPS PAP TEST WITH HPV

## 2014-09-07 LAB — HEMOGLOBIN, FINGERSTICK: Hemoglobin, fingerstick: 13.2 g/dL (ref 12.0–16.0)

## 2014-09-07 NOTE — Progress Notes (Signed)
Encounter reviewed by Dr. Tashena Ibach Amundson C. Silva.  

## 2014-09-18 ENCOUNTER — Telehealth: Payer: Self-pay | Admitting: Emergency Medicine

## 2014-09-18 DIAGNOSIS — R87810 Cervical high risk human papillomavirus (HPV) DNA test positive: Secondary | ICD-10-CM

## 2014-09-18 NOTE — Telephone Encounter (Signed)
-----   Message from Ria Comment, FNP sent at 09/16/2014  8:58 AM EDT ----- Dr. Edward Jolly says to proceed with a colpo biopsy - she had one done last year by Dr. Farrel Gobble. Please inform pt and schedule ----- Message -----    From: Patton Salles, MD    Sent: 09/16/2014   8:24 AM      To: Ria Comment, FNP  I recommend proceeding with colposcopy.

## 2014-09-18 NOTE — Telephone Encounter (Signed)
Patient notified of message from  Lauro Franklin, FNP. Marland Kitchen  She is agreeable to scheduling colposcopy.  Brief description of procedure given to patient. Patient remembers from last year procedure with Dr. Farrel Gobble.  Colposcopy pre-procedure instructions given. Discussed menses and need to not have any bleeding on day of appointment, advised to call to reschedule if starts cycle. Patient will have Nuva Ring in place. Not due to take it out until end of month, July.  Make sure to eat a meal and hydrate before appointment.  Advised 800 mg of Motrin with food one hour prior to appointment. Motrin/Advil or Ibuprofen. Take 800 mg (Can purchase over the counter, you will need four 200 mg pills).  Advised will need to cancel or reschedule within 72 hours or will have $150.00 no show fee placed to account.   Patient verbalized understanding of preprocedure instructions and cancellation policy and will call to reschedule if will be on menses or has any concerns regarding pregnancy.  Patient is advised she will be contacted with insurance coverage information.    cc Lilyan Gilford for insurance pre certification. Patient also in precert for [facility] IUD, can you give both when calling? Scheduled for 09/24/14 at 1500 with Dr. Edward Jolly. Arrival at 1445.  Routing to provider for final review. Patient agreeable to disposition. Will close encounter.

## 2014-09-24 ENCOUNTER — Ambulatory Visit (INDEPENDENT_AMBULATORY_CARE_PROVIDER_SITE_OTHER): Payer: 59 | Admitting: Obstetrics and Gynecology

## 2014-09-24 ENCOUNTER — Encounter: Payer: Self-pay | Admitting: Obstetrics and Gynecology

## 2014-09-24 DIAGNOSIS — R87810 Cervical high risk human papillomavirus (HPV) DNA test positive: Secondary | ICD-10-CM | POA: Diagnosis not present

## 2014-09-24 NOTE — Progress Notes (Signed)
Subjective:     Patient ID: Monica Bridges, female   DOB: March 08, 1990, 24 y.o.   MRN: [id number]  HPI  Patient here for colposcopy today due to normal pap but Positive HR HPV on 09-02-14.    Patient has history of pap on 09-02-13 Ascus:Pos HR HPV with colposcopy revealing CIN I.   Review of Systems  LMP: 08-27-14 Contraception: Nuvaring.  Waiting for approval for Skyla.      Objective:   Physical Exam  Genitourinary:     Colposcopy. Consent for procedure.  NuvaRing removed and discarded per patient request.  (Time to take it out.) 3% acetic acid placed in vagina.  Satisfactory colposcopy.  Ectropion noted and rim of acetowhite change around cervix circumferentially.  ECC taken and sent to path. Punctations at 8:00.   Biopsy taken and sent to path. Mosaics at 2:00.  Biopsy taken and sent to path. Monsel's placed.  Minimal EBL.  No complications.     Assessment:     HPV changes.  Positive HR HPV.     Plan:     Discussion of HPV, abnormal paps, and treatment if needed with LEEP. Observational management of moderate dysplasia in young women also discussed. Follow up biopsies.  Instructions and precautions given.  Await Skyla until after biopsy results have returned and plan made for follow up of colposcopy.  _____15__ minutes face to face time of which over 50% was spent in counseling.   After visit summary to patient.

## 2014-09-24 NOTE — Patient Instructions (Signed)

## 2014-09-26 ENCOUNTER — Encounter: Payer: Self-pay | Admitting: Obstetrics and Gynecology

## 2014-09-29 LAB — IPS OTHER TISSUE BIOPSY

## 2014-10-01 ENCOUNTER — Telehealth: Payer: Self-pay | Admitting: Emergency Medicine

## 2014-10-01 NOTE — Telephone Encounter (Signed)
Message left to return call to Cozette Braggs at [phone].   08 Recall placed  

## 2014-10-01 NOTE — Telephone Encounter (Signed)
-----   Message from Patton Salles, MD sent at 09/30/2014  9:06 PM EDT ----- Please report results to patient showing LGSIL of cervix.  No cancer was seen.  Plan for pap in one year.  Recall - 08.  OK to proceed with Skyla IUD.   Cc- Claudette Laws.

## 2014-10-06 NOTE — Telephone Encounter (Signed)
Message left to return call to Sosie Gato at [phone].    

## 2014-10-09 NOTE — Telephone Encounter (Signed)
Based on workque notes, patient is precerted and ok to schedule when ready.

## 2014-10-15 NOTE — Telephone Encounter (Signed)
Message left to return call to Airyana Sprunger at [phone].    

## 2014-10-20 NOTE — Telephone Encounter (Signed)
08 Recall in place for 07/2015.  Patient returned call and message from Dr. Edward Jolly given.  Verbalized understanding of results and very important for follow up in one year for repeat pap smear.  Patient advised to call with first day of her cycle to schedule Skyla IUD insertion. She is agreeable.  Routing to provider for final review. Patient agreeable to disposition. Will close encounter.

## 2014-10-22 ENCOUNTER — Encounter: Payer: Self-pay | Admitting: Neurology

## 2014-10-22 ENCOUNTER — Ambulatory Visit (INDEPENDENT_AMBULATORY_CARE_PROVIDER_SITE_OTHER): Payer: 59 | Admitting: Neurology

## 2014-10-22 VITALS — BP 102/60 | HR 68 | Resp 16 | Ht 70.0 in | Wt 199.0 lb

## 2014-10-22 DIAGNOSIS — Z720 Tobacco use: Secondary | ICD-10-CM | POA: Diagnosis not present

## 2014-10-22 DIAGNOSIS — G4710 Hypersomnia, unspecified: Secondary | ICD-10-CM | POA: Diagnosis not present

## 2014-10-22 DIAGNOSIS — E663 Overweight: Secondary | ICD-10-CM

## 2014-10-22 DIAGNOSIS — R0683 Snoring: Secondary | ICD-10-CM | POA: Diagnosis not present

## 2014-10-22 DIAGNOSIS — G4719 Other hypersomnia: Secondary | ICD-10-CM | POA: Diagnosis not present

## 2014-10-22 DIAGNOSIS — F17200 Nicotine dependence, unspecified, uncomplicated: Secondary | ICD-10-CM

## 2014-10-22 NOTE — Patient Instructions (Signed)
I believe you do have a condition that causes you to be excessively sleepy. We will do further sleep testing, as discussed.  Please remember to try to maintain good sleep hygiene, which means: Keep a regular sleep and wake schedule, try not to exercise or have a meal within 2 hours of your bedtime, try to keep your bedroom conducive for sleep, that is, cool and dark, without light distractors such as an illuminated alarm clock, and refrain from watching TV right before sleep or in the middle of the night and do not keep the TV or radio on during the night. Also, try not to use or play on electronic devices at bedtime, such as your cell phone, tablet PC or laptop. If you like to read at bedtime on an electronic device, try to dim the background light as much as possible. Do not eat in the middle of the night.    Do not drive when sleepy.   We will also look for signs of sleep apnea.   We may have to try different medications that may help you stay awake during the day. Not everything works with everybody the same way. Wake promoting agents include stimulants and non-stimulant type medications. The most common side effects with stimulants are weight loss, insomnia, nervousness, headaches, palpitations, rise in blood pressure, anxiety. Stimulants can be addictive and subject to abuse. Non-stimulant type wake promoting medications include Provigil and Nuvigil, most common side effects include headaches, nervousness, insomnia, hypertension.  Please stop smoking and do not use any illicit drugs.

## 2014-10-22 NOTE — Progress Notes (Signed)
Subjective:    Patient ID: Monica Bridges is a 24 y.o. female.  HPI     Huston Foley, MD, PhD [facility] [address], [address] [address] [place], [place] [address]  Dear Dr. Barbaraann Barthel,   I saw your patient, Monica Bridges, upon your kind request in my neurologic clinic today for initial consultation of her sleep disorder, in particular, concern for underlying obstructive sleep apnea. The patient is unaccompanied today. As you know, Monica Bridges is a 24 year old right-handed woman with an underlying medical history of depression, hepatitis C (in the past, no need for treatment?), smoking, and overweight state, who reports snoring and significant excessive daytime somnolence. She reports difficulty staying awake during class. She works part-time as a Armed forces training and education officer. She is at [employer] for biology. Her sleepiness has been ongoing for about a year. She has been noted to have pauses in her breathing while asleep. Bedtime is around midnight. Rise time is around 8 AM. She feels drowsy first thing in the morning. She does not wake up rested. She denies frank restless leg symptoms. In addition, she denies hypnagogic or hypnopompic hallucinations, cataplexy or sleep paralysis. She denies parasomnias. She is not sure if she twitches her legs and her sleep. She lives with house mates. She is unmarried and has no children. She has been utilizing caffeine-containing energy drinks as needed to help stay awake for long distance driving. She has smoked cannabis rarely. She drinks alcohol 5-7 times a week. She smokes one or 2 cigarettes a day. She is trying to quit smoking. She is going to switch to a new contraceptive method, IUD soon. On weekends she goes to bed around 10 PM because she has to get up at 6 AM. Despite having enough sleep she feels that she has significant daytime somnolence. She can sleep longer if possible. Her Epworth sleepiness score is 19 out of 24 today, her  fatigue score is 46 out of 63. She has no family history of obstructive sleep apnea or narcolepsy. She states she had blood work through your office about a month ago including thyroid function checkup which per her were normal.  Her Past Medical History Is Significant For: Past Medical History  Diagnosis Date  . 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 Depression   . OSA (obstructive sleep apnea)     Her Past Surgical History Is Significant For: Past Surgical History  Procedure Laterality Date  . Colposcopy  7/12    CIN I  . Wisdom tooth extraction      Her Family History Is Significant For: Family History  Problem Relation Age of Onset  . Cancer Maternal Aunt     Lung    Her Social History Is Significant For: Social History   Social History  . Marital Status: Single    Spouse Name: N/A  . Number of Children: 0  . Years of Education: College   Occupational History  . [employer]    Social History Main Topics  . Smoking status: Light Tobacco Smoker -- 0.25 packs/day    Types: Cigarettes  . Smokeless tobacco: Never Used  . Alcohol Use: 0.6 oz/week    0 Standard drinks or equivalent, 1 Cans of beer per week  . Drug Use: Yes    Special: Marijuana  . Sexual Activity:    Partners: Male    Birth  Control/ Protection: Inserts     Comment: Nuvaring   Other Topics Concern  . None   Social History Narrative   3-4 energy drinks a week     Her Allergies Are:  No Known Allergies:   Her Current Medications Are:  Outpatient Encounter Prescriptions as of 10/22/2014  Medication Sig  . etonogestrel-ethinyl estradiol (NUVARING) 0.12-0.015 MG/24HR vaginal ring Insert vaginally and leave in place for 4 consecutive weeks, then remove for 3d  . Prenatal Vit-Fe Fumarate-FA (MULTIVITAMIN-PRENATAL) 27-0.8 MG TABS tablet Take 1 tablet by mouth daily at 12 noon.   No  facility-administered encounter medications on file as of 10/22/2014.  :  Review of Systems:  Out of a complete 14 point review of systems, all are reviewed and negative with the exception of these symptoms as listed below:  Review of Systems  Neurological:       Patient states that she feels like she does not fall into deep sleep. Daytime tiredness, no trouble falling or staying asleep, bedmate has reported that patient has restless sleep, snoring, witnessed apnea, wakes up in the morning feeling tired, sometimes takes naps.     Objective:  Neurologic Exam  Physical Exam Physical Examination:   Filed Vitals:   10/22/14 1034  BP: 102/60  Pulse: 68  Resp: 16   General Examination: The patient is a very pleasant 24 y.o. female in no acute distress. She appears well-developed and well-nourished and well groomed.   HEENT: Normocephalic, atraumatic, pupils are equal, round and reactive to light and accommodation. Funduscopic exam is normal with sharp disc margins noted. Extraocular tracking is good without limitation to gaze excursion or nystagmus noted. Normal smooth pursuit is noted. Hearing is grossly intact. Tympanic membranes are clear bilaterally. Face is symmetric with normal facial animation and normal facial sensation. Speech is clear with no dysarthria noted. There is no hypophonia. There is no lip, neck/head, jaw or voice tremor. Neck is supple with full range of passive and active motion. There are no carotid bruits on auscultation. Oropharynx exam reveals: mild mouth dryness, adequate dental hygiene and moderate airway crowding, due to thicker soft palate and tonsils in place, 3+ on the right and 2+ on the left. She does have a narrow appearing airway. Mallampati is class II. Tongue protrudes centrally and palate elevates symmetrically. Neck size is 14.5 inches. She has a Mild overbite. Nasal inspection reveals no significant nasal mucosal bogginess or redness and no septal deviation.    Chest: Clear to auscultation without wheezing, rhonchi or crackles noted.  Heart: S1+S2+0, regular and normal without murmurs, rubs or gallops noted.   Abdomen: Soft, non-tender and non-distended with normal bowel sounds appreciated on auscultation.  Extremities: There is no pitting edema in the distal lower extremities bilaterally. Pedal pulses are intact.  Skin: Warm and dry without trophic changes noted. There are no varicose veins.  Musculoskeletal: exam reveals no obvious joint deformities, tenderness or joint swelling or erythema.   Neurologically:  Mental status: The patient is awake, alert and oriented in all 4 spheres. Her immediate and remote memory, attention, language skills and fund of knowledge are appropriate. There is no evidence of aphasia, agnosia, apraxia or anomia. Speech is clear with normal prosody and enunciation. Thought process is linear. Mood is normal and affect is normal.  Cranial nerves II - XII are as described above under HEENT exam. In addition: shoulder shrug is normal with equal shoulder height noted. Motor exam: Normal bulk, strength and tone is noted. There is  no drift, tremor or rebound. Romberg is negative. Reflexes are 2+ throughout. Babinski: Toes are flexor bilaterally. Fine motor skills and coordination: intact with normal finger taps, normal hand movements, normal rapid alternating patting, normal foot taps and normal foot agility.  Cerebellar testing: No dysmetria or intention tremor on finger to nose testing. Heel to shin is unremarkable bilaterally. There is no truncal or gait ataxia.  Sensory exam: intact to light touch, pinprick, vibration, temperature sense in the upper and lower extremities.  Gait, station and balance: She stands easily. No veering to one side is noted. No leaning to one side is noted. Posture is age-appropriate and stance is narrow based. Gait shows normal stride length and normal pace. No problems turning are noted. She turns  en bloc. Tandem walk is unremarkable.   Assessment and Plan:   In summary, Monica Bridges is a very pleasant 24 y.o.-year old female with an underlying medical history of depression, hepatitis C (in the past, no need for treatment?), smoking, and overweight state, who reports snoring and significant excessive daytime somnolence. She has been witnessed to have pauses in her breathing and she does have a crowded appearing airway. Nevertheless, her main concern is quite severe daytime somnolence. While she may be at risk for obstructive sleep apnea, would like to bring her back for more extended sleep testing with a nocturnal polysomnogram followed by a daytime nap test (MSLT) to help delineate her hypersomnolence further. If she has sleep disordered breathing we will pursue treatment for obstructive sleep apnea. I talked to her about OSA, its prognosis and treatment options and in particular splint to her the risks and ramifications of untreated obstructive sleep apnea with respect to cardiovascular disease down the Road. We talked a little bit about sleepiness disorders including narcolepsy and idiopathic hypersomnolence. Her history does not suggest narcolepsy. Idiopathic hypersomnolence is not excluded at this time. I did talk to her about a healthy lifestyle and weight loss. She is advised to quit smoking altogether and stop all illicit drugs. We also talked about symptomatic treatments such as stimulant medications and other non-stimulant type medications. We will pick up our discussion after her sleep studies are completed. I answered all her questions today and the patient was in agreement. Thank you very much for allowing me to participate in the care of this nice patient. If I can be of any further assistance to you please do not hesitate to call me at [phone].  Sincerely,   Huston Foley, MD, PhD

## 2014-10-28 ENCOUNTER — Telehealth: Payer: Self-pay | Admitting: Obstetrics and Gynecology

## 2014-10-28 MED ORDER — MISOPROSTOL 200 MCG PO TABS: ORAL_TABLET | ORAL | Status: DC

## 2014-10-28 NOTE — Telephone Encounter (Signed)
Patient started cycle today and calling to schedule iud procedure.

## 2014-10-28 NOTE — Telephone Encounter (Signed)
Pharmacy is calling to clara fication on a medication use. Oral or vaginal?

## 2014-10-28 NOTE — Telephone Encounter (Signed)
Called and s/w pharmacist at [facility], clarified that Cytotec is to be taken orally for patient.  Encounter closed.

## 2014-10-28 NOTE — Telephone Encounter (Signed)
Left message to call [name] at [phone]. 

## 2014-10-28 NOTE — Telephone Encounter (Signed)
Spoke with patient. Patient started her cycle on 8/30 and would like to schedule IUD insertion. Offered appointment for Friday 9/2 at 10 am but patient declines. Requesting a morning appointment. Appointment scheduled for 9/2 at 1:15 pm with Dr.Silva. Patient is agreeable to date and time. Pre procedure instructions given.  Motrin instructions given. Motrin=Advil=Ibuprofen, 800 mg one hour before appointment. Eat a meal and hydrate well before appointment. Cytotec instructions given. Take one tablet the night before procedure and one tablet the morning of procedure.   Routing to provider for final review. Patient agreeable to disposition. Will close encounter.

## 2014-10-30 ENCOUNTER — Encounter: Payer: Self-pay | Admitting: Obstetrics and Gynecology

## 2014-10-30 ENCOUNTER — Ambulatory Visit (INDEPENDENT_AMBULATORY_CARE_PROVIDER_SITE_OTHER): Payer: 59 | Admitting: Obstetrics and Gynecology

## 2014-10-30 VITALS — BP 100/60 | HR 72 | Resp 16 | Ht 70.0 in | Wt 196.0 lb

## 2014-10-30 DIAGNOSIS — Z30430 Encounter for insertion of intrauterine contraceptive device: Secondary | ICD-10-CM | POA: Diagnosis not present

## 2014-10-30 DIAGNOSIS — Z3009 Encounter for other general counseling and advice on contraception: Secondary | ICD-10-CM

## 2014-10-30 DIAGNOSIS — Z309 Encounter for contraceptive management, unspecified: Secondary | ICD-10-CM | POA: Diagnosis not present

## 2014-10-30 LAB — POCT URINE PREGNANCY: Preg Test, Ur: NEGATIVE

## 2014-10-30 NOTE — Progress Notes (Signed)
GYNECOLOGY  VISIT   HPI: 24 y.o.   Single  Caucasian  female   G0P0000 with Patient's last menstrual period was 10/27/2014.   here for IUD Insertion.    Menses are heavy and crampy for first couple of days.  They are regular on NuvaRing.  Took ring out.   Took Cytotec last hs and this am.   UPT negative in office today.  Colpo 09/25/14 showed LGSIL.  Plan for pap in one year.   GYNECOLOGIC HISTORY: Patient's last menstrual period was 10/27/2014. Contraception: Nuvaring Menopausal hormone therapy: none Last mammogram: Never Last pap smear: 09/02/14 Neg. HR HPV:+Detected. Colpo: LGSIL.        OB History    Gravida Para Term Preterm AB TAB SAB Ectopic Multiple Living   0 0 0 0 0 0 0 0 0 0          Patient Active Problem List   Diagnosis Date Noted  . History of abnormal Pap smear 07/26/2012  . Depression     Past Medical History  Diagnosis Date  . 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 Depression   . OSA (obstructive sleep apnea)     Past Surgical History  Procedure Laterality Date  . Colposcopy  7/12    CIN I  . Wisdom tooth extraction      Current Outpatient Prescriptions  Medication Sig Dispense Refill  . misoprostol (CYTOTEC) 200 MCG tablet Take one tablet the night before procedure and one tablet the morning of procedure. 2 tablet 0  . Prenatal Vit-Fe Fumarate-FA (MULTIVITAMIN-PRENATAL) 27-0.8 MG TABS tablet Take 1 tablet by mouth daily at 12 noon.    . etonogestrel-ethinyl estradiol (NUVARING) 0.12-0.015 MG/24HR vaginal ring Insert vaginally and leave in place for 4 consecutive weeks, then remove for 3d (Patient not taking: Reported on 10/30/2014) 3 each 3   No current facility-administered medications for this visit.     ALLERGIES: Review of patient's allergies indicates no known allergies.  Family History  Problem Relation Age of Onset  . Cancer Maternal  Aunt     Lung    Social History   Social History  . Marital Status: Single    Spouse Name: N/A  . Number of Children: 0  . Years of Education: College   Occupational History  . [employer]    Social History Main Topics  . Smoking status: Light Tobacco Smoker -- 0.25 packs/day    Types: Cigarettes  . Smokeless tobacco: Never Used  . Alcohol Use: 0.6 oz/week    0 Standard drinks or equivalent, 1 Cans of beer per week  . Drug Use: Yes    Special: Marijuana  . Sexual Activity:    Partners: Male    Birth Control/ Protection: Inserts     Comment: Nuvaring   Other Topics Concern  . Not on file   Social History Narrative   3-4 energy drinks a week     ROS:  Pertinent items are noted in HPI.  PHYSICAL EXAMINATION:    BP 100/60 mmHg  Pulse 72  Resp 16  Ht 5\' 10"  (1.778 m)  Wt 196 lb (88.905 kg)  BMI 28.12 kg/m2  LMP 10/27/2014    General appearance: alert, cooperative and appears stated age   Pelvic: External genitalia:  no lesions              Urethra:  normal appearing  urethra with no masses, tenderness or lesions              Bartholins and Skenes: normal                 Vagina: normal appearing vagina with normal color and discharge, no lesions              Cervix: no lesions       Bimanual Exam:  Uterus:  normal size, contour, position, consistency, mobility, non-tender              Adnexa: normal adnexa and no mass, fullness, tenderness              Consent for Skyla IUD insertion.  Lot [id number], expiration 02/17. Speculum placed in vagina.  Sterile prep of cervix with betadine.  Paracervical block with 10 cc 1% lidocaine - lot [id number], expiration 02/28/15. Tenaculum to anterior cervical lip.  Uterus sounded to 6.5 cm.  Skyla IUD placed without difficulty.  Strings trimmed and shown to patient.  No complications.  Minimal EBL.  Chaperone was present for exam.  ASSESSMENT  Skyla IUD insertion.  PLAN  Instructions and precautions given.  Back up  contraception discussed. Card given to patient with insertion date, recommended removal date, and lot number.  Follow up for a recheck in 5 weeks, sooner as needed.  After visit summary to patient.

## 2014-10-30 NOTE — Patient Instructions (Signed)

## 2014-11-19 ENCOUNTER — Telehealth: Payer: Self-pay | Admitting: Neurology

## 2014-11-19 DIAGNOSIS — R0683 Snoring: Secondary | ICD-10-CM

## 2014-11-19 DIAGNOSIS — G4719 Other hypersomnia: Secondary | ICD-10-CM

## 2014-11-19 DIAGNOSIS — E663 Overweight: Secondary | ICD-10-CM

## 2014-11-19 DIAGNOSIS — G4710 Hypersomnia, unspecified: Secondary | ICD-10-CM

## 2014-11-19 NOTE — Telephone Encounter (Signed)
Called patient to talk to her about her insurance denial and I had to cancel her sleep test because of this.  I would like to schedule her for a HST instead.

## 2014-11-19 NOTE — Telephone Encounter (Signed)
Dr. Frances Furbish patient.  Order put in for home sleep test.

## 2014-11-19 NOTE — Telephone Encounter (Signed)
[employer] denied Polysomnography and MSLT for this patient.  [employer] wants to do a HST first.  Please order the HST if this is what [facility] wants.

## 2014-12-01 ENCOUNTER — Encounter (INDEPENDENT_AMBULATORY_CARE_PROVIDER_SITE_OTHER): Payer: 59 | Admitting: Neurology

## 2014-12-01 DIAGNOSIS — G4719 Other hypersomnia: Secondary | ICD-10-CM

## 2014-12-01 DIAGNOSIS — R0683 Snoring: Secondary | ICD-10-CM

## 2014-12-01 DIAGNOSIS — Z0289 Encounter for other administrative examinations: Secondary | ICD-10-CM

## 2014-12-01 DIAGNOSIS — G4710 Hypersomnia, unspecified: Secondary | ICD-10-CM

## 2014-12-01 DIAGNOSIS — E663 Overweight: Secondary | ICD-10-CM

## 2014-12-09 ENCOUNTER — Telehealth: Payer: Self-pay | Admitting: Neurology

## 2014-12-09 ENCOUNTER — Ambulatory Visit (INDEPENDENT_AMBULATORY_CARE_PROVIDER_SITE_OTHER): Payer: 59 | Admitting: Obstetrics and Gynecology

## 2014-12-09 ENCOUNTER — Encounter: Payer: Self-pay | Admitting: Obstetrics and Gynecology

## 2014-12-09 VITALS — BP 100/62 | HR 70 | Ht 70.0 in | Wt 194.2 lb

## 2014-12-09 DIAGNOSIS — Z30431 Encounter for routine checking of intrauterine contraceptive device: Secondary | ICD-10-CM | POA: Diagnosis not present

## 2014-12-09 DIAGNOSIS — IMO0002 Reserved for concepts with insufficient information to code with codable children: Secondary | ICD-10-CM

## 2014-12-09 DIAGNOSIS — R896 Abnormal cytological findings in specimens from other organs, systems and tissues: Secondary | ICD-10-CM

## 2014-12-09 NOTE — Telephone Encounter (Signed)
Left message for patient to call back for sleep study results.  

## 2014-12-09 NOTE — Telephone Encounter (Signed)
Patient referred by Dr. Barbaraann Barthelankins, seen by me on 10/22/14:  Please call and notify the patient that the recent home sleep test did not show any significant obstructive sleep apnea. Patient can follow up with the referring provider or we can schedule a follow up appointment to discuss, if she desires. I recommend: maintain good sleep hygiene, which means: Keep a regular sleep and wake schedule, try not to exercise or have a meal within 2 hours of your bedtime, try to keep your bedroom conducive for sleep, that is, cool and dark, without light distractors such as an illuminated alarm clock, and refrain from watching TV right before sleep or in the middle of the night and do not keep the TV or radio on during the night. Also, try not to use or play on electronic devices at bedtime, such as your cell phone, tablet PC or laptop. If you like to read at bedtime on an electronic device, try to dim the background light as much as possible. Do not eat in the middle of the night.   If she would like a FU appt, pls send her a 2 week sleep diary to complete before appt.   A copy of the report will be sent to the patient, the PCP and referring MD, if other than PCP.   Once you have spoken to patient, you can close this encounter.   Thanks,  Huston FoleySaima Saanvika Vazques, MD, PhD [facility]

## 2014-12-09 NOTE — Telephone Encounter (Signed)
I spoke to patient and gave her the results of sleep study. I gave patient recommendations below and she would like to complete the sleep diary and make f/u with Dr. Frances FurbishAthar. We set appt. I confirmed mailing address and will send sleep diary and information sheet with good sleep hygiene recommendations. I will fax report to PCP.

## 2014-12-09 NOTE — Progress Notes (Signed)
Patient ID: Monica Bridges, female   DOB: 10/24/1990, 24 y.o.   MRN: [id number] GYNECOLOGY  VISIT   HPI: 24 y.o.   Single  Caucasian  female   G0P0000 with Patient's last menstrual period was 10/27/2014 (exact date).   here for 5 week follow up after Skyla IUD insertion performed on 10/30/14. Had persistent spotting off and on.  No pain.  Just has occasional cramp.  Sexually active and no problems with pain or bleeding.  Unable to feel strings.  Tried once.  GYNECOLOGIC HISTORY: Patient's last menstrual period was 10/27/2014 (exact date). Contraception:Skyla IUD inserted 11-09-14 Menopausal hormone therapy: n/a Last mammogram: n/a Last pap smear: 09-02-14 Neg:Pos HR HPV;colposcopy LGSIL.         OB History    Gravida Para Term Preterm AB TAB SAB Ectopic Multiple Living   0 0 0 0 0 0 0 0 0 0          Patient Active Problem List   Diagnosis Date Noted  . History of abnormal Pap smear 07/26/2012  . Depression     Past Medical History  Diagnosis Date  . 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 Depression   . OSA (obstructive sleep apnea)     Past Surgical History  Procedure Laterality Date  . Colposcopy  7/12    CIN I  . Wisdom tooth extraction      Current Outpatient Prescriptions  Medication Sig Dispense Refill  . Levonorgestrel (SKYLA) 13.5 MG IUD by Intrauterine route.    . Prenatal Vit-Fe Fumarate-FA (MULTIVITAMIN-PRENATAL) 27-0.8 MG TABS tablet Take 1 tablet by mouth daily at 12 noon.     No current facility-administered medications for this visit.     ALLERGIES: Review of patient's allergies indicates no known allergies.  Family History  Problem Relation Age of Onset  . Cancer Maternal Aunt     Lung    Social History   Social History  . Marital Status: Single    Spouse Name: N/A  . Number of Children: 0  . Years of Education: College   Occupational  History  . [employer]    Social History Main Topics  . Smoking status: Light Tobacco Smoker -- 0.25 packs/day    Types: Cigarettes  . Smokeless tobacco: Never Used  . Alcohol Use: 0.6 oz/week    0 Standard drinks or equivalent, 1 Cans of beer per week  . Drug Use: Yes    Special: Marijuana  . Sexual Activity:    Partners: Male    Birth Control/ Protection: IUD     Comment: Skyla inserted 10-30-14   Other Topics Concern  . Not on file   Social History Narrative   3-4 energy drinks a week     ROS:  Pertinent items are noted in HPI.  PHYSICAL EXAMINATION:    BP 100/62 mmHg  Pulse 70  Ht 5\' 10"  (1.778 m)  Wt 194 lb 3.2 oz (88.089 kg)  BMI 27.86 kg/m2  LMP 10/27/2014 (Exact Date)    General appearance: alert, cooperative and appears stated age   Pelvic: External genitalia:  no lesions              Urethra:  normal appearing urethra with no masses, tenderness or lesions              Bartholins and Skenes: normal  Vagina: normal appearing vagina with normal color and discharge, no lesions              Cervix: no lesions and IUD strings seen.             Bimanual Exam:  Uterus:  normal size, contour, position, consistency, mobility, non-tender              Adnexa: normal adnexa and no mass, fullness, tenderness          Chaperone was present for exam.  ASSESSMENT  Skyla IUD patient.  Hx LGSIL.  PLAN  Counseled regarding Skyla IUD bleeding profile.  Reassured that it is working for contraception. Use condoms for STD prevention.  Follow up in July 2017 for pap and annual exam.   An After Visit Summary was printed and given to the patient.  __15____ minutes face to face time of which over 50% was spent in counseling.

## 2014-12-31 ENCOUNTER — Ambulatory Visit (INDEPENDENT_AMBULATORY_CARE_PROVIDER_SITE_OTHER): Payer: Self-pay | Admitting: Neurology

## 2014-12-31 ENCOUNTER — Encounter: Payer: Self-pay | Admitting: Neurology

## 2014-12-31 VITALS — BP 122/60 | HR 80 | Resp 16 | Ht 70.0 in | Wt 193.0 lb

## 2014-12-31 DIAGNOSIS — E663 Overweight: Secondary | ICD-10-CM

## 2014-12-31 DIAGNOSIS — G4719 Other hypersomnia: Secondary | ICD-10-CM

## 2014-12-31 NOTE — Patient Instructions (Addendum)
Please make sure, that Dr. Zachery DauerBarnes has checked your thyroid function, your vitamin D and B12 levels.   Please remember to try to maintain good sleep hygiene, which means: Keep a regular sleep and wake schedule, try not to exercise or have a meal within 2 hours of your bedtime, try to keep your bedroom conducive for sleep, that is, cool and dark, without light distractors such as an illuminated alarm clock, and refrain from watching TV right before sleep or in the middle of the night and do not keep the TV or radio on during the night. Also, try not to use or play on electronic devices at bedtime, such as your cell phone, tablet PC or laptop. If you like to read at bedtime on an electronic device, try to dim the background light as much as possible. Do not eat in the middle of the night.

## 2014-12-31 NOTE — Progress Notes (Signed)
Subjective:    Patient ID: Monica Bridges is a 24 y.o. female.  HPI     Interim history:   Monica Bridges is a 24 year old right-handed woman with an underlying medical history of depression, hepatitis C (in the past, no need for treatment?), smoking, and overweight state, who presents for follow-up consultation of her sleep disturbance, after her recent home sleep test. The patient is unaccompanied today. I first saw her on 10/22/2014 at the request of her primary care physician, at which time she reported snoring and excessive daytime somnolence. I invited her back for sleep study. Her insurance approved a home sleep test. She had this on 12/02/2014. It showed an AHI of 1.5 per hour, oxyhemoglobin desaturation nadir was 89%, and I asked her to follow-up with me in clinic with a two-week sleep diary.   Today, 12/31/2014: She reports that she did not bring a sleep diary as she kept forgetting it. She has no new symptoms. She tries to allow for enough sleep time. She does not drink caffeine on a daily basis. Sometimes she will drink an energy drink if she has to be on the longer distances car ride. She has had no new symptoms. She denies sleep attacks. She denies sleep paralysis or cataplexy. She has had no medication changes.   Previously:   10/22/2014: She reports snoring and significant excessive daytime somnolence. She reports difficulty staying awake during class. She works part-time as a Armed forces training and education officer. She is at [employer] for biology. Her sleepiness has been ongoing for about a year. She has been noted to have pauses in her breathing while asleep. Bedtime is around midnight. Rise time is around 8 AM. She feels drowsy first thing in the morning. She does not wake up rested. She denies frank restless leg symptoms. In addition, she denies hypnagogic or hypnopompic hallucinations, cataplexy or sleep paralysis. She denies parasomnias. She is not sure if she twitches her legs and her sleep.  She lives with house mates. She is unmarried and has no children. She has been utilizing caffeine-containing energy drinks as needed to help stay awake for long distance driving. She has smoked cannabis rarely. She drinks alcohol 5-7 times a week. She smokes one or 2 cigarettes a day. She is trying to quit smoking. She is going to switch to a new contraceptive method, IUD soon. On weekends she goes to bed around 10 PM because she has to get up at 6 AM. Despite having enough sleep she feels that she has significant daytime somnolence. She can sleep longer if possible. Her Epworth sleepiness score is 19 out of 24 today, her fatigue score is 46 out of 63. She has no family history of obstructive sleep apnea or narcolepsy. She states she had blood work through your office about a month ago including thyroid function checkup which per her were normal.  Her Past Medical History Is Significant For: Past Medical History  Diagnosis Date  . 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 Depression   . OSA (obstructive sleep apnea)     Her Past Surgical History Is Significant For: Past Surgical History  Procedure Laterality Date  . Colposcopy  7/12    CIN I  . Wisdom tooth extraction      Her Family History Is Significant For: Family History  Problem Relation Age of Onset  . Cancer Maternal Aunt  Lung    Her Social History Is Significant For: Social History   Social History  . Marital Status: Single    Spouse Name: N/A  . Number of Children: 0  . Years of Education: College   Occupational History  . [employer]    Social History Main Topics  . Smoking status: Light Tobacco Smoker -- 0.25 packs/day    Types: Cigarettes  . Smokeless tobacco: Never Used  . Alcohol Use: 0.6 oz/week    0 Standard drinks or equivalent, 1 Cans of beer per week  . Drug Use: Yes    Special: Marijuana  . Sexual  Activity:    Partners: Male    Birth Control/ Protection: IUD     Comment: Skyla inserted 10-30-14   Other Topics Concern  . None   Social History Narrative   3-4 energy drinks a week     Her Allergies Are:  No Known Allergies:   Her Current Medications Are:  Outpatient Encounter Prescriptions as of 12/31/2014  Medication Sig  . Levonorgestrel (SKYLA) 13.5 MG IUD by Intrauterine route.  . Prenatal Vit-Fe Fumarate-FA (MULTIVITAMIN-PRENATAL) 27-0.8 MG TABS tablet Take 1 tablet by mouth daily at 12 noon.   No facility-administered encounter medications on file as of 12/31/2014.  :  Review of Systems:  Out of a complete 14 point review of systems, all are reviewed and negative with the exception of these symptoms as listed below:   Review of Systems  Neurological:       Patient is here to discuss sleep study. No new concerns.     Objective:  Neurologic Exam  Physical Exam Physical Examination:   Filed Vitals:   12/31/14 1506  BP: 122/60  Pulse: 80  Resp: 16   General Examination: The patient is a very pleasant 24 y.o. female in no acute distress. She appears well-developed and well-nourished and well groomed.   HEENT: Normocephalic, atraumatic, pupils are equal, round and reactive to light and accommodation. Extraocular tracking is good without limitation to gaze excursion or nystagmus noted. Normal smooth pursuit is noted. Hearing is grossly intact. Tympanic membranes are clear bilaterally. Face is symmetric with normal facial animation and normal facial sensation. Speech is clear with no dysarthria noted. There is no hypophonia. There is no lip, neck/head, jaw or voice tremor. Neck is supple with full range of passive and active motion. There are no carotid bruits on auscultation. Oropharynx exam reveals: mild mouth dryness, adequate dental hygiene and moderate airway crowding, due to thicker soft palate and tonsils in place, 3+ on the right and 2+ on the left. She does have a  narrow appearing airway. Mallampati is class II. Tongue protrudes centrally and palate elevates symmetrically.   Chest: Clear to auscultation without wheezing, rhonchi or crackles noted.  Heart: S1+S2+0, regular and normal without murmurs, rubs or gallops noted.   Abdomen: Soft, non-tender and non-distended with normal bowel sounds appreciated on auscultation.  Extremities: There is no pitting edema in the distal lower extremities bilaterally. Pedal pulses are intact.  Skin: Warm and dry without trophic changes noted. There are no varicose veins.  Musculoskeletal: exam reveals no obvious joint deformities, tenderness or joint swelling or erythema.   Neurologically:  Mental status: The patient is awake, alert and oriented in all 4 spheres. Her immediate and remote memory, attention, language skills and fund of knowledge are appropriate. There is no evidence of aphasia, agnosia, apraxia or anomia. Speech is clear with normal prosody and enunciation. Thought process is  linear. Mood is normal and affect is normal.  Cranial nerves II - XII are as described above under HEENT exam. In addition: shoulder shrug is normal with equal shoulder height noted. Motor exam: Normal bulk, strength and tone is noted. There is no drift, tremor or rebound. Romberg is negative. Reflexes are 2+ throughout. Fine motor skills and coordination: intact with normal finger taps, normal hand movements, normal rapid alternating patting, normal foot taps and normal foot agility.  Cerebellar testing: No dysmetria or intention tremor on finger to nose testing. Heel to shin is unremarkable bilaterally. There is no truncal or gait ataxia.  Sensory exam: intact to light touch in the upper and lower extremities.  Gait, station and balance: She stands easily. No veering to one side is noted. No leaning to one side is noted. Posture is age-appropriate and stance is narrow based. Gait shows normal stride length and normal pace. No  problems turning are noted. She turns en bloc. Tandem walk is unremarkable.   Assessment and Plan:   In summary, Monica Bridges is a very pleasant 24 year old female with an underlying medical history of depression, hepatitis C, smoking, and overweight state, who presents for follow-up consultation of her daytime sleepiness. She has had a home sleep test recently which showed no significant sleep disordered breathing. Her insurance denied a polysomnogram and MSLT. Her history is not compelling for narcolepsy. She is advised regarding good sleep hygiene. Her symptoms aren't changed. If she has any new symptoms at be happy to see her again. She is advised to monitor her symptoms and we talked about other medical conditions that can cause sleepiness including thyroid dysfunction. She is advised to follow-up with her primary care physician in that regard.  I would be happy to review her sleep diary when she fills it out. She can bring it by here. At this juncture, I will follow her on an as-needed basis. I spent 10 minutes in total face-to-face time with the patient, more than 50% of which was spent in counseling and coordination of care, reviewing test results, reviewing medication and discussing or reviewing the diagnosis of sleepiness, its prognosis and treatment options.

## 2015-01-12 ENCOUNTER — Telehealth: Payer: Self-pay

## 2015-01-12 NOTE — Telephone Encounter (Signed)
Left message for patient to call me regarding her insurance. [employer]ed Insurance company to get authorization for MSLT. They said her insurance had been terminated.

## 2015-09-06 ENCOUNTER — Ambulatory Visit (INDEPENDENT_AMBULATORY_CARE_PROVIDER_SITE_OTHER): Payer: No Typology Code available for payment source | Admitting: Nurse Practitioner

## 2015-09-06 ENCOUNTER — Encounter: Payer: Self-pay | Admitting: Nurse Practitioner

## 2015-09-06 VITALS — BP 90/66 | HR 64 | Ht 71.0 in | Wt 192.0 lb

## 2015-09-06 DIAGNOSIS — Z113 Encounter for screening for infections with a predominantly sexual mode of transmission: Secondary | ICD-10-CM | POA: Diagnosis not present

## 2015-09-06 DIAGNOSIS — Z0000 Encounter for general adult medical examination without abnormal findings: Secondary | ICD-10-CM | POA: Diagnosis not present

## 2015-09-06 DIAGNOSIS — Z01419 Encounter for gynecological examination (general) (routine) without abnormal findings: Secondary | ICD-10-CM | POA: Diagnosis not present

## 2015-09-06 LAB — POCT URINALYSIS DIPSTICK
Bilirubin, UA: NEGATIVE
Glucose, UA: NEGATIVE
Ketones, UA: NEGATIVE
Nitrite, UA: NEGATIVE
Protein, UA: NEGATIVE
Urobilinogen, UA: NEGATIVE
pH, UA: 5

## 2015-09-06 LAB — HEMOGLOBIN, FINGERSTICK: Hemoglobin, fingerstick: 13.8 g/dL (ref 12.0–16.0)

## 2015-09-06 NOTE — Progress Notes (Signed)
Patient ID: Monica Bridges, female   DOB: 07/11/1990, 25 y.o.   MRN: [id number]  25 y.o. G0P0000 Single  Caucasian Fe here for annual exam.  Skyla IUD was inserted 10/2014.  initially had regular menses and BTB at odd times.  Now past few months some better with only occasional spotting when wiping but occasional light flow with tampon use.  She has a new partner since 10/2014 and wants STD's.  Patient's last menstrual period was 09/06/2015 (exact date).          Sexually active: Yes.   New partner since September 2016. The current method of family planning is condoms never and IUD.  Skyla insertion 10/30/14.  Exercising: Yes.    Gym/ health club routine includes cardio, mostly elliptical twice weekly and walking. Smoker:  Yes, approx 1 pack per week  Health Maintenance: Pap:09/02/14, Negative with pos HR HPV, Colpo 09/25/14 LGSIL; Pap: 08/15/13 ASCUS + HR HPV: detected. Colpo Biopsy CIN1 09/02/2013 TDaP: 2008 Gardasil 2007 HIV: 09/02/14 Labs: HB: 13.8   Urine: RBC large, trace leuk's (menses)   reports that she has been smoking Cigarettes.  She has been smoking about 0.25 packs per day. She has never used smokeless tobacco. She reports that she drinks about 0.6 oz of alcohol per week. She reports that she uses illicit drugs (Marijuana).  Past Medical History  Diagnosis Date  . 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 Depression   . OSA (obstructive sleep apnea)     Past Surgical History  Procedure Laterality Date  . Colposcopy  7/12    CIN I  . Wisdom tooth extraction      Current Outpatient Prescriptions  Medication Sig Dispense Refill  . Levonorgestrel (SKYLA) 13.5 MG IUD by Intrauterine route.    . Prenatal Vit-Fe Fumarate-FA (MULTIVITAMIN-PRENATAL) 27-0.8 MG TABS tablet Take 1 tablet by mouth daily at 12 noon.     No current facility-administered medications for this visit.    Family  History  Problem Relation Age of Onset  . Cancer Maternal Aunt     Lung    ROS:  Pertinent items are noted in HPI.  Otherwise, a comprehensive ROS was negative.  Exam:   BP 90/66 mmHg  Pulse 64  Ht 5\' 11"  (1.803 m)  Wt 192 lb (87.091 kg)  BMI 26.79 kg/m2  LMP 09/06/2015 (Exact Date) Height: 5\' 11"  (180.3 cm) Ht Readings from Last 3 Encounters:  09/06/15 5\' 11"  (1.803 m)  12/31/14 5\' 10"  (1.778 m)  12/09/14 5\' 10"  (1.778 m)    General appearance: alert, cooperative and appears stated age Head: Normocephalic, without obvious abnormality, atraumatic Neck: no adenopathy, supple, symmetrical, trachea midline and thyroid normal to inspection and palpation Lungs: clear to auscultation bilaterally Breasts: normal appearance, no masses or tenderness Heart: regular rate and rhythm Abdomen: soft, non-tender; no masses,  no organomegaly Extremities: extremities normal, atraumatic, no cyanosis or edema Skin: Skin color, texture, turgor normal. No rashes or lesions Lymph nodes: Cervical, supraclavicular, and axillary nodes normal. No abnormal inguinal nodes palpated Neurologic: Grossly normal   Pelvic: External genitalia:  no lesions              Urethra:  normal appearing urethra with no masses, tenderness or lesions              Bartholin's and Skene's: normal  Vagina: normal appearing vagina with normal color and light flow, no lesions              Cervix: anteverted IUD string is visible              Pap taken: Yes.   Bimanual Exam:  Uterus:  normal size, contour, position, consistency, mobility, non-tender              Adnexa: no mass, fullness, tenderness               Rectovaginal: Confirms               Anus:  normal sphincter tone, no lesions  Chaperone present: yes  A:  Well Woman with normal exam  Contraception with Skyla IUD inserted on 10/30/2014 History of ASCUS with HR HPV - colp Bx 09/02/2013 CIN I  History of LGSIL with HR HPV - colpo  biopsy 09/25/14 R/O vaginitis R/O STD's   P:   Reviewed health and wellness pertinent to exam  Pap smear was done - 1st pap after Colp biopsy last year 10/2014  Will follow with labs  Counseled on breast self exam, STD prevention, HIV risk factors and prevention, adequate intake of calcium and vitamin D, diet and exercise return annually or prn  An After Visit Summary was printed and given to the patient.

## 2015-09-06 NOTE — Patient Instructions (Signed)
General topics  Next pap or exam is  due in 1 year Take a Women's multivitamin Take 1200 mg. of calcium daily - prefer dietary If any concerns in interim to call back  Breast Self-Awareness Practicing breast self-awareness may pick up problems early, prevent significant medical complications, and possibly save your life. By practicing breast self-awareness, you can become familiar with how your breasts look and feel and if your breasts are changing. This allows you to notice changes early. It can also offer you some reassurance that your breast health is good. One way to learn what is normal for your breasts and whether your breasts are changing is to do a breast self-exam. If you find a lump or something that was not present in the past, it is best to contact your caregiver right away. Other findings that should be evaluated by your caregiver include nipple discharge, especially if it is bloody; skin changes or reddening; areas where the skin seems to be pulled in (retracted); or new lumps and bumps. Breast pain is seldom associated with cancer (malignancy), but should also be evaluated by a caregiver. BREAST SELF-EXAM The best time to examine your breasts is 5 7 days after your menstrual period is over.  ExitCare Patient Information 2013 ExitCare, LLC.   Exercise to Stay Healthy Exercise helps you become and stay healthy. EXERCISE IDEAS AND TIPS Choose exercises that:  You enjoy.  Fit into your day. You do not need to exercise really hard to be healthy. You can do exercises at a slow or medium level and stay healthy. You can:  Stretch before and after working out.  Try yoga, Pilates, or tai chi.  Lift weights.  Walk fast, swim, jog, run, climb stairs, bicycle, dance, or rollerskate.  Take aerobic classes. Exercises that burn about 150 calories:  Running 1  miles in 15 minutes.  Playing volleyball for 45 to 60 minutes.  Washing and waxing a car for 45 to 60  minutes.  Playing touch football for 45 minutes.  Walking 1  miles in 35 minutes.  Pushing a stroller 1  miles in 30 minutes.  Playing basketball for 30 minutes.  Raking leaves for 30 minutes.  Bicycling 5 miles in 30 minutes.  Walking 2 miles in 30 minutes.  Dancing for 30 minutes.  Shoveling snow for 15 minutes.  Swimming laps for 20 minutes.  Walking up stairs for 15 minutes.  Bicycling 4 miles in 15 minutes.  Gardening for 30 to 45 minutes.  Jumping rope for 15 minutes.  Washing windows or floors for 45 to 60 minutes. Document Released: 03/18/2010 Document Revised: 05/08/2011 Document Reviewed: 03/18/2010 ExitCare Patient Information 2013 ExitCare[employer] LLC.   Other topics ( that may be useful information):    Sexually Transmitted Disease Sexually transmitted disease (STD) refers to any infection that is passed from person to person during sexual activity. This may happen by way of saliva, semen, blood, vaginal mucus, or urine. Common STDs include:  Gonorrhea.  Chlamydia.  Syphilis.  HIV/AIDS.  Genital herpes.  Hepatitis B and C.  Trichomonas.  Human papillomavirus (HPV).  Pubic lice. CAUSES  An STD may be spread by bacteria, virus, or parasite. A person can get an STD by:  Sexual intercourse with an infected person.  Sharing sex toys with an infected person.  Sharing needles with an infected person.  Having intimate contact with the genitals, mouth, or rectal areas of an infected person. SYMPTOMS  Some people may not have any symptoms, but   they can still pass the infection to others. Different STDs have different symptoms. Symptoms include:  Painful or bloody urination.  Pain in the pelvis, abdomen, vagina, anus, throat, or eyes.  Skin rash, itching, irritation, growths, or sores (lesions). These usually occur in the genital or anal area.  Abnormal vaginal discharge.  Penile discharge in men.  Soft, flesh-colored skin growths in the  genital or anal area.  Fever.  Pain or bleeding during sexual intercourse.  Swollen glands in the groin area.  Yellow skin and eyes (jaundice). This is seen with hepatitis. DIAGNOSIS  To make a diagnosis, your caregiver may:  Take a medical history.  Perform a physical exam.  Take a specimen (culture) to be examined.  Examine a sample of discharge under a microscope.  Perform blood test TREATMENT   Chlamydia, gonorrhea, trichomonas, and syphilis can be cured with antibiotic medicine.  Genital herpes, hepatitis, and HIV can be treated, but not cured, with prescribed medicines. The medicines will lessen the symptoms.  Genital warts from HPV can be treated with medicine or by freezing, burning (electrocautery), or surgery. Warts may come back.  HPV is a virus and cannot be cured with medicine or surgery.However, abnormal areas may be followed very closely by your caregiver and may be removed from the cervix, vagina, or vulva through office procedures or surgery. If your diagnosis is confirmed, your recent sexual partners need treatment. This is true even if they are symptom-free or have a negative culture or evaluation. They should not have sex until their caregiver says it is okay. HOME CARE INSTRUCTIONS  All sexual partners should be informed, tested, and treated for all STDs.  Take your antibiotics as directed. Finish them even if you start to feel better.  Only take over-the-counter or prescription medicines for pain, discomfort, or fever as directed by your caregiver.  Rest.  Eat a balanced diet and drink enough fluids to keep your urine clear or pale yellow.  Do not have sex until treatment is completed and you have followed up with your caregiver. STDs should be checked after treatment.  Keep all follow-up appointments, Pap tests, and blood tests as directed by your caregiver.  Only use latex condoms and water-soluble lubricants during sexual activity. Do not use  petroleum jelly or oils.  Avoid alcohol and illegal drugs.  Get vaccinated for HPV and hepatitis. If you have not received these vaccines in the past, talk to your caregiver about whether one or both might be right for you.  Avoid risky sex practices that can break the skin. The only way to avoid getting an STD is to avoid all sexual activity.Latex condoms and dental dams (for oral sex) will help lessen the risk of getting an STD, but will not completely eliminate the risk. SEEK MEDICAL CARE IF:   You have a fever.  You have any new or worsening symptoms. Document Released: 05/06/2002 Document Revised: 05/08/2011 Document Reviewed: 05/13/2010 [facility] Patient Information 2013 [place].    Domestic Abuse You are being battered or abused if someone close to you hits, pushes, or physically hurts you in any way. You also are being abused if you are forced into activities. You are being sexually abused if you are forced to have sexual contact of any kind. You are being emotionally abused if you are made to feel worthless or if you are constantly threatened. It is important to remember that help is available. No one has the right to abuse you. PREVENTION OF FURTHER  ABUSE  Learn the warning signs of danger. This varies with situations but may include: the use of alcohol, threats, isolation from friends and family, or forced sexual contact. Leave if you feel that violence is going to occur.  If you are attacked or beaten, report it to the police so the abuse is documented. You do not have to press charges. The police can protect you while you or the attackers are leaving. Get the officer's name and badge number and a copy of the report.  Find someone you can trust and tell them what is happening to you: your caregiver, a nurse, clergy member, close friend or family member. Feeling ashamed is natural, but remember that you have done nothing wrong. No one deserves abuse. Document Released:  02/11/2000 Document Revised: 05/08/2011 Document Reviewed: 04/21/2010 ExitCare Patient Information 2013 ExitCare[employer] LLC.    How Much is Too Much Alcohol? Drinking too much alcohol can cause injury, accidents, and health problems. These types of problems can include:   Car crashes.  Falls.  Family fighting (domestic violence).  Drowning.  Fights.  Injuries.  Burns.  Damage to certain organs.  Having a baby with birth defects. ONE DRINK CAN BE TOO MUCH WHEN YOU ARE:  Working.  Pregnant or breastfeeding.  Taking medicines. Ask your doctor.  Driving or planning to drive. If you or someone you know has a drinking problem, get help from a doctor.  Document Released: 12/10/2008 Document Revised: 05/08/2011 Document Reviewed: 12/10/2008 ExitCare Patient Information 2013 ExitCare[employer] LLC.   Smoking Hazards Smoking cigarettes is extremely bad for your health. Tobacco smoke has over 200 known poisons in it. There are over 60 chemicals in tobacco smoke that cause cancer. Some of the chemicals found in cigarette smoke include:   Cyanide.  Benzene.  Formaldehyde.  Methanol (wood alcohol).  Acetylene (fuel used in welding torches).  Ammonia. Cigarette smoke also contains the poisonous gases nitrogen oxide and carbon monoxide.  Cigarette smokers have an increased risk of many serious medical problems and Smoking causes approximately:  90% of all lung cancer deaths in men.  80% of all lung cancer deaths in women.  90% of deaths from chronic obstructive lung disease. Compared with nonsmokers, smoking increases the risk of:  Coronary heart disease by 2 to 4 times.  Stroke by 2 to 4 times.  Men developing lung cancer by 23 times.  Women developing lung cancer by 13 times.  Dying from chronic obstructive lung diseases by 12 times.  . Smoking is the most preventable cause of death and disease in our society.  WHY IS SMOKING ADDICTIVE?  Nicotine is the chemical  agent in tobacco that is capable of causing addiction or dependence.  When you smoke and inhale, nicotine is absorbed rapidly into the bloodstream through your lungs. Nicotine absorbed through the lungs is capable of creating a powerful addiction. Both inhaled and non-inhaled nicotine may be addictive.  Addiction studies of cigarettes and spit tobacco show that addiction to nicotine occurs mainly during the teen years, when young people begin using tobacco products. WHAT ARE THE BENEFITS OF QUITTING?  There are many health benefits to quitting smoking.   Likelihood of developing cancer and heart disease decreases. Health improvements are seen almost immediately.  Blood pressure, pulse rate, and breathing patterns start returning to normal soon after quitting. QUITTING SMOKING   American Lung Association - 1-800-LUNGUSA  American Cancer Society - 1-800-ACS-2345 Document Released: 03/23/2004 Document Revised: 05/08/2011 Document Reviewed: 11/25/2008 ExitCare Patient Information 2013 ExitCare,   LLC.   Stress Management Stress is a state of physical or mental tension that often results from changes in your life or normal routine. Some common causes of stress are:  Death of a loved one.  Injuries or severe illnesses.  Getting fired or changing jobs.  Moving into a new home. Other causes may be:  Sexual problems.  Business or financial losses.  Taking on a large debt.  Regular conflict with someone at home or at work.  Constant tiredness from lack of sleep. It is not just bad things that are stressful. It may be stressful to:  Win the lottery.  Get married.  Buy a new car. The amount of stress that can be easily tolerated varies from person to person. Changes generally cause stress, regardless of the types of change. Too much stress can affect your health. It may lead to physical or emotional problems. Too little stress (boredom) may also become stressful. SUGGESTIONS TO  REDUCE STRESS:  Talk things over with your family and friends. It often is helpful to share your concerns and worries. If you feel your problem is serious, you may want to get help from a professional counselor.  Consider your problems one at a time instead of lumping them all together. Trying to take care of everything at once may seem impossible. List all the things you need to do and then start with the most important one. Set a goal to accomplish 2 or 3 things each day. If you expect to do too many in a single day you will naturally fail, causing you to feel even more stressed.  Do not use alcohol or drugs to relieve stress. Although you may feel better for a short time, they do not remove the problems that caused the stress. They can also be habit forming.  Exercise regularly - at least 3 times per week. Physical exercise can help to relieve that "uptight" feeling and will relax you.  The shortest distance between despair and hope is often a good night's sleep.  Go to bed and get up on time allowing yourself time for appointments without being rushed.  Take a short "time-out" period from any stressful situation that occurs during the day. Close your eyes and take some deep breaths. Starting with the muscles in your face, tense them, hold it for a few seconds, then relax. Repeat this with the muscles in your neck, shoulders, hand, stomach, back and legs.  Take good care of yourself. Eat a balanced diet and get plenty of rest.  Schedule time for having fun. Take a break from your daily routine to relax. HOME CARE INSTRUCTIONS   Call if you feel overwhelmed by your problems and feel you can no longer manage them on your own.  Return immediately if you feel like hurting yourself or someone else. Document Released: 08/09/2000 Document Revised: 05/08/2011 Document Reviewed: 04/01/2007 ExitCare Patient Information 2013 ExitCare, LLC.  

## 2015-09-07 ENCOUNTER — Telehealth: Payer: Self-pay | Admitting: *Deleted

## 2015-09-07 ENCOUNTER — Other Ambulatory Visit: Payer: Self-pay | Admitting: Nurse Practitioner

## 2015-09-07 LAB — STD PANEL
HIV 1&2 Ab, 4th Generation: NONREACTIVE
Hepatitis B Surface Ag: NEGATIVE

## 2015-09-07 LAB — WET PREP BY MOLECULAR PROBE
Candida species: NEGATIVE
Gardnerella vaginalis: POSITIVE — AB
Trichomonas vaginosis: NEGATIVE

## 2015-09-07 NOTE — Telephone Encounter (Signed)
I have attempted to contact this patient by phone with the following results: left message to return call to Monica Bridges at [phone] on answering machine (mobile [facility]  No personal information given.  [phone] (Mobile)

## 2015-09-07 NOTE — Telephone Encounter (Signed)
-----   Message from Ria CommentPatricia Grubb, FNP sent at 09/07/2015  8:19 AM EDT ----- Please let pt know that STD panel with HIV, Hep B, STS is all normal.  The GC and Chl is pending.  The vaginal wet prep is positive for BV and med's have been sent to the pharmacy.

## 2015-09-08 DIAGNOSIS — A64 Unspecified sexually transmitted disease: Secondary | ICD-10-CM

## 2015-09-08 HISTORY — DX: Unspecified sexually transmitted disease: A64

## 2015-09-08 LAB — IPS N GONORRHOEA AND CHLAMYDIA BY PCR

## 2015-09-08 LAB — IPS PAP TEST WITH HPV

## 2015-09-08 MED ORDER — AZITHROMYCIN 1 G PO PACK: 1.0000 | PACK | Freq: Once | ORAL | Status: DC

## 2015-09-08 MED ORDER — METRONIDAZOLE 0.75 % VA GEL: 1.0000 | Freq: Every day | VAGINAL | Status: DC

## 2015-09-08 NOTE — Telephone Encounter (Signed)
-----   Message from Ria CommentPatricia Grubb, FNP sent at 09/08/2015  1:04 PM EDT ----- Please let pt know that Chlamydia was positive and GC was negative.  She will need Zithromax, HD card, notify partner(s), TOC.   Pap is normal but history of LGSIL 08/2014 - 08 recall.

## 2015-09-08 NOTE — Telephone Encounter (Signed)
Spoke with patient. Advised of all results as seen below. She is agreeable and verbalizes understanding. Rx for Metrogel place 1 applicator qhs for 5 days and Zithromax 1 gram take orally once 0RF have been sent to pharmacy on file. Patient is aware she will need to notify her partner(s) for treatment of Chlamydia. 3 month recheck scheduled for 12/13/2015 at 10:15 am with Ria CommentPatricia Grubb, FNP. She is agreeable to date and time. Health department form and results faxed to [phone] with cover sheet and confirmation. Form to Ria CommentPatricia Grubb, FNP for review and signature for scan. 08 recall placed.  Routing to provider for final review. Patient agreeable to disposition. Will close encounter.

## 2015-09-12 NOTE — Progress Notes (Signed)
Encounter reviewed by Dr. Brook Amundson C. Silva.  

## 2015-09-27 ENCOUNTER — Telehealth: Payer: Self-pay | Admitting: Nurse Practitioner

## 2015-09-27 MED ORDER — AZITHROMYCIN 1 G PO PACK: 1.0000 | PACK | Freq: Once | ORAL | 0 refills | Status: AC

## 2015-09-27 NOTE — Telephone Encounter (Signed)
Return call to patient. She states she took Azithromycin 1 gram orally for treatment of Chlamydia. She waited 7 days and resumed intercourse with her partner who was not treated or tested and reports condom broke during intercourse yesterday.  Patient has Skyla IUD in place. No irregular bleeding, no pain, no discharge or fevers.   Requests refill of Azithromycin 1 gram.   Requests information for her partner on where/how to seek treatment and information given regarding [facility] Department and STD clinics run by the Health department.   Advised will send message to Ria Comment, FNP and return call with plan of care. Patient agreeable.

## 2015-09-27 NOTE — Telephone Encounter (Signed)
She does need to be re treated with Zithromax 1 gm.  Avoid all SA until he is treated and confirmation of treatment.

## 2015-09-27 NOTE — Telephone Encounter (Signed)
Left message on mobile okay per designated party release form that prescription was sent in. No further details left. Advised to call back with any questions.

## 2015-09-27 NOTE — Telephone Encounter (Signed)
Patient called requesting to speak with the nurse about her last lab results.

## 2015-10-14 NOTE — Telephone Encounter (Signed)
Please close encounter unless follow up is needed.

## 2015-12-13 ENCOUNTER — Ambulatory Visit (INDEPENDENT_AMBULATORY_CARE_PROVIDER_SITE_OTHER): Payer: No Typology Code available for payment source | Admitting: Nurse Practitioner

## 2015-12-13 ENCOUNTER — Encounter: Payer: Self-pay | Admitting: Nurse Practitioner

## 2015-12-13 VITALS — BP 92/60 | HR 58 | Resp 14 | Wt 196.0 lb

## 2015-12-13 DIAGNOSIS — A64 Unspecified sexually transmitted disease: Secondary | ICD-10-CM

## 2015-12-13 NOTE — Progress Notes (Signed)
25 y.o. Single Caucasian female G0P0000 here for follow up of chlamydia treated with Zithromax initiated on 09/08/15.   Completed all medication as directed.  Denies any symptoms of abdominal or pelvic pain.  She denies any UTI symptoms.  Same partner since 11/16 and he was treated as well.  O: Healthy WD,WN female Affect: normal Abdomen:soft, non tender, normal bowel sounds Pelvic exam:EXTERNAL GENITALIA: normal appearing vulva with no masses, tenderness or lesions VAGINA: no abnormal discharge or lesions CERVIX: no lesions or cervical motion tenderness, IUD string are visible. UTERUS: gravid and anteverted  A: TOC for chlamydia   P:  Discussed findings and will follow with labs  Labs:  GC & CHL     RV

## 2015-12-13 NOTE — Patient Instructions (Signed)
Chlamydia Test  WHY AM I HAVING THIS TEST?  This a test to see if you have chlamydia. Chlamydia is a common sexually transmitted disease (STD). Your health care provider may perform this test if you:  · Are sexually active.  · Have another STD.  · Have complaints about pelvic pain, vaginal discharge, or both.  WHAT KIND OF SAMPLE IS TAKEN?  Depending on your symptoms, your health care provider may collect any one of the following samples:  · A blood sample. This is usually collected by inserting a needle into a vein.  · A tissue sample. This is collected by swabbing tissue of the eye, urethra, or cervix.  · A sample of sputum. This is collected by having you cough into a sterile container that is provided by the lab.  HOW DO I PREPARE FOR THE TEST?  There is no preparation required for this test.  HOW ARE THE TEST RESULTS REPORTED?  Your test results will be reported as either positive or negative. It is your responsibility to obtain your test results. Ask the lab or department performing the test when and how you will get your results.  WHAT DO THE RESULTS MEAN?  A positive result means that you have a chlamydia infection.  Talk with your health care provider to discuss your results, treatment options, and if necessary, the need for more tests. Talk with your health care provider if you have any questions about your results.     This information is not intended to replace advice given to you by your health care provider. Make sure you discuss any questions you have with your health care provider.     Document Released: 03/08/2004 Document Revised: 03/06/2014 Document Reviewed: 07/09/2013  Elsevier Interactive Patient Education ©2016 Elsevier Inc.

## 2015-12-14 LAB — GC/CHLAMYDIA PROBE AMP
CT Probe RNA: DETECTED — AB
GC Probe RNA: NOT DETECTED

## 2015-12-16 ENCOUNTER — Telehealth: Payer: Self-pay

## 2015-12-16 MED ORDER — AZITHROMYCIN 1 G PO PACK: 1.0000 | PACK | Freq: Once | ORAL | 0 refills | Status: AC

## 2015-12-16 NOTE — Telephone Encounter (Addendum)
Notes Recorded by Ria CommentPatricia Grubb, FNP on 12/15/2015 at 12:08 PM EDT Addendum: The GC was negative. ------  Notes Recorded by Ria CommentPatricia Grubb, FNP on 12/15/2015 at 12:07 PM EDT Please inform pt that her Chlamydia is positive again. At last treatment in July she had called back and said she was SA and condom broke before partner got tested and treated. She was retreated. Now at last visit states he was treated. This time make sure she is aware not to be SA until after he has been tested and treated and not resume SA for a week. With her having an IUD she is at greater risk of PID. If she gets another STD we may need to remove IUD. Needs Zithromax, health department card, and TOC scheduled.

## 2015-12-16 NOTE — Telephone Encounter (Signed)
Spoke with patient. Advised of results and message as seen below from Ria CommentPatricia Grubb, FNP. Patient is agreeable and verbalizes understanding. Aware she will need to abstain from intercourse until she and her partner have received treatment and for 1 week following. Will need to use condoms for protection against STD's with intercourse. Aware of her risk for PID and the need for removal of her IUD if she has a reoccurrence of an STD. Patient verbalizes understanding. Zithromax 1 gram take 1 packet once #1 0RF sent to pharmacy on file. Health department confidential communicable disease report completed and fax[facility] at [phone]. Report to Ria CommentPatricia Grubb, FNP for review and signature for scan.  Routing to provider for final review. Patient agreeable to disposition. Will close encounter.

## 2015-12-19 NOTE — Progress Notes (Signed)
Encounter reviewed by Dr. Brook Amundson C. Silva.  

## 2016-01-11 ENCOUNTER — Telehealth: Payer: Self-pay | Admitting: *Deleted

## 2016-01-11 NOTE — Telephone Encounter (Signed)
Reviewed with Dr Hyacinth MeekerMiller. Call to patient to schedule follow-up appointment for TOC. Upon MD review (internal audit) recommend TOC appointment following treatment and discuss with MD. Left message to call back.

## 2016-01-12 NOTE — Telephone Encounter (Signed)
Call to patient regarding follow-up appointment. Left message to call back.

## 2016-01-18 NOTE — Telephone Encounter (Signed)
Follow-up call to patient. [facility] with Dr Hyacinth MeekerMiller particularly with IUD in place. Patient states she does not have insurance at this time but is anticipating policy to be re-established in next week or so. She states she cannot afford appointment until insurance effective. States she will call back in a couple of weeks to schedule. Advised we will follow-up in 30 days if has not scheduled. Stressed importance of the follow-up TOC and confirmed with patient that she and partner have both been treated.   Routing to provider for final review.

## 2016-01-19 NOTE — Telephone Encounter (Signed)
Call to patient. Advised of review with MD. Since 3 month TOC in October was still positive, recommend repeat TOC ASAP. Stressed importance of this with IUD in place. Appointment scheduled for 01-28-16 with Dr Hyacinth MeekerMiller. Patient declined earlier appointment.   Routing to provider for final review. Patient agreeable to disposition. Will close encounter.

## 2016-01-19 NOTE — Telephone Encounter (Signed)
As we discussed, please have pt come sooner for appt.  Thanks.

## 2016-01-28 ENCOUNTER — Ambulatory Visit (INDEPENDENT_AMBULATORY_CARE_PROVIDER_SITE_OTHER): Payer: BLUE CROSS/BLUE SHIELD | Admitting: Obstetrics & Gynecology

## 2016-01-28 ENCOUNTER — Encounter: Payer: Self-pay | Admitting: Obstetrics & Gynecology

## 2016-01-28 VITALS — BP 110/60 | HR 74 | Resp 14 | Ht 70.0 in | Wt 198.8 lb

## 2016-01-28 DIAGNOSIS — A749 Chlamydial infection, unspecified: Secondary | ICD-10-CM

## 2016-01-28 NOTE — Progress Notes (Signed)
GYNECOLOGY  VISIT   HPI: 25 y.o. G0P0000 Single Caucasian female with h/o two positive chlamydia tests who is here for repeat testing.  Pt not SA for about a month.  Prior to this was with partner who gave her the chlamydia.  He was treated.  She reports that he did not have any repeat testing.  She does not feel she has been symptomatic with the positive chlamydia in the past.    Denies pelvic pain or fever.  No urinary or bowel changes.  GYNECOLOGIC HISTORY: Patient's last menstrual period was 01/28/2016. Contraception: Skyla IUD placed 9/16.  Patient Active Problem List   Diagnosis Date Noted  . History of abnormal Pap smear 07/26/2012  . Depression     Past Medical History:  Diagnosis Date  . 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 Depression   . 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 OSA (obstructive sleep apnea)   . STD (sexually transmitted disease) 09/08/2015   Chlamydia     Past Surgical History:  Procedure Laterality Date  . COLPOSCOPY  7/12   CIN I  . WISDOM TOOTH EXTRACTION      MEDS:  Reviewed in EPIC and UTD  ALLERGIES: Patient has no known allergies.  Family History  Problem Relation Age of Onset  . Cancer Maternal Aunt     Lung   SH:  Single, non smoker  Review of Systems  All other systems reviewed and are negative.   PHYSICAL EXAMINATION:    BP 110/60 (BP Location: Left Arm, Patient Position: Sitting, Cuff Size: Normal)   Pulse 74   Resp 14   Ht 5\' 10"  (1.778 m)   Wt 198 lb 12.8 oz (90.2 kg)   LMP 01/28/2016   BMI 28.52 kg/m     General appearance: alert, cooperative and appears stated age Abdomen: soft, non-tender; bowel sounds normal; no masses,  no organomegaly  Pelvic: External genitalia:  no lesions              Urethra:  normal appearing urethra with no masses, tenderness or lesions              Bartholins and Skenes: normal                 Vagina: normal appearing vagina  with normal color and discharge, no lesions              Cervix: no lesions, IUD string noted              Bimanual Exam:  Uterus:  normal size, contour, position, consistency, mobility, non-tender              Adnexa: no mass, fullness, tenderness  Chaperone was present for exam.  Assessment: + Chlamydia x 2 Skyla IUD  Plan: GC/Chl testing today.  If positive, will retreat and retest.  If positive after that, pt will need IUD removed and retreated.  She cannot have another IUD placed until she has a negative chlamydia test.  Pt voices clear understanding.

## 2016-01-29 LAB — GC/CHLAMYDIA PROBE AMP
CT Probe RNA: NOT DETECTED
GC Probe RNA: NOT DETECTED

## 2016-01-31 ENCOUNTER — Telehealth: Payer: Self-pay | Admitting: *Deleted

## 2016-01-31 NOTE — Telephone Encounter (Signed)
Patient returned call. Patient notified of lab results. Patient verbalized understanding and appreciative of phone call.   Routing to provider for final review. Patient agreeable to disposition. Will close encounter.

## 2016-01-31 NOTE — Telephone Encounter (Signed)
-----   Message from Jerene BearsMary S Miller, MD sent at 01/31/2016  7:19 AM EST ----- Please let pt know this was negative for both GC and Chlamydia.  No additional follow up for this is needed.  Cc:  Billie RuddySally Yeakley

## 2016-01-31 NOTE — Telephone Encounter (Signed)
Message left to return call to Maxi Carreras at [phone].    

## 2016-03-13 ENCOUNTER — Ambulatory Visit (INDEPENDENT_AMBULATORY_CARE_PROVIDER_SITE_OTHER): Payer: BLUE CROSS/BLUE SHIELD | Admitting: Nurse Practitioner

## 2016-03-13 VITALS — BP 100/60 | HR 74 | Resp 12 | Ht 70.0 in | Wt 196.2 lb

## 2016-03-13 DIAGNOSIS — Z202 Contact with and (suspected) exposure to infections with a predominantly sexual mode of transmission: Secondary | ICD-10-CM

## 2016-03-13 NOTE — Progress Notes (Signed)
26 y.o. Single Caucasian female G0P0000 here for follow up of positive chlamydia from 12/13/15.  She was treated wit Zithromax 1 GM.  She then returned for retesting on 01/28/16 due to history of having a Skyla IUD.  This was her second positive chlamydia with IUD.   Completed all medication as directed.  Denies any symptoms of abdominal or pelvic pain.  Same partner and he was treated.  With IUD has only occasional spotting   O: Healthy WD,WN female Affect: normal  Abdomen:soft, non tender, normal bowel sounds Pelvic exam:EXTERNAL GENITALIA: normal appearing vulva with no masses, tenderness or lesions VAGINA: no abnormal discharge or lesions CERVIX: no lesions or cervical motion tenderness,  IUD strings are visible ADNEXA: no masses palpable and non tender  A: Chlamydia TOC  IUD in place    P:  Discussed TOC and if positive again will need IUD removed   Labs :  GC/ CHL  RV

## 2016-03-13 NOTE — Patient Instructions (Signed)
Chlamydia, Female Chlamydia is an infection. It is spread from one person to another person during sexual contact. This infection can be in the cervix, urine tube (urethra), throat, or bottom (rectum). This infection needs treatment. HOME CARE   Take your medicines (antibiotics) as told. Finish them even if you start to feel better.  Only take medicine as told by your doctor.  Tell your sex partner(s) that you have chlamydia. They must also be treated.  Do not have sex until your doctor says it is okay.  Rest.  Eat healthy. Drink enough fluids to keep your pee (urine) clear or pale yellow.  Keep all doctor visits as told. GET HELP IF:  You have pain when you pee.  You have belly pain.  You have vaginal discharge.  You have pain during sex.  You have bleeding between periods and after sex.  You have a fever. GET HELP RIGHT AWAY IF:   You feel sick to your stomach (nauseous) or you throw up (vomit).  You sweat much more than normal (diaphoresis).  You have trouble swallowing. This information is not intended to replace advice given to you by your health care provider. Make sure you discuss any questions you have with your health care provider. Document Released: 11/23/2007 Document Revised: 06/07/2015 Document Reviewed: 10/21/2012 Elsevier Interactive Patient Education  2017 [employer]   

## 2016-03-14 LAB — GC/CHLAMYDIA PROBE AMP
CT Probe RNA: NOT DETECTED
GC Probe RNA: NOT DETECTED

## 2016-03-16 ENCOUNTER — Encounter: Payer: Self-pay | Admitting: Nurse Practitioner

## 2016-03-16 NOTE — Progress Notes (Signed)
Encounter reviewed by Dr. Brook Amundson C. Silva.  

## 2016-06-04 ENCOUNTER — Encounter (HOSPITAL_COMMUNITY): Payer: Self-pay | Admitting: Oncology

## 2016-06-04 ENCOUNTER — Emergency Department (HOSPITAL_COMMUNITY)
Admission: EM | Admit: 2016-06-04 | Discharge: 2016-06-04 | Disposition: A | Discharge status: Home / Self Care | Payer: BLUE CROSS/BLUE SHIELD | Attending: Emergency Medicine | Admitting: Emergency Medicine

## 2016-06-04 DIAGNOSIS — R21 Rash and other nonspecific skin eruption: Secondary | ICD-10-CM | POA: Diagnosis not present

## 2016-06-04 DIAGNOSIS — B029 Zoster without complications: Secondary | ICD-10-CM | POA: Diagnosis not present

## 2016-06-04 DIAGNOSIS — F17210 Nicotine dependence, cigarettes, uncomplicated: Secondary | ICD-10-CM | POA: Insufficient documentation

## 2016-06-04 MED ORDER — IBUPROFEN 600 MG PO TABS: 600.0000 mg | ORAL_TABLET | Freq: Four times a day (QID) | ORAL | 0 refills | Status: DC | PRN

## 2016-06-04 MED ORDER — HYDROXYZINE HCL 25 MG PO TABS: 25.0000 mg | ORAL_TABLET | Freq: Four times a day (QID) | ORAL | 0 refills | Status: DC | PRN

## 2016-06-04 NOTE — Discharge Instructions (Signed)
Given that your symptoms began 5 days ago, antivirals will unlikely shorten the course of your illness. We advise ibuprofen and/or tylenol for pain or body aches. You may take Atarax for itching, as needed. Follow up with your primary care doctor to ensure resolution of symptoms.

## 2016-06-04 NOTE — ED Triage Notes (Signed)
Pt presents d/t rash on left lower back as well as left groin.  Area is scabbed over.  Pt denies any changes in laundry detergent, soaps, shampoo or dietary items.  Reports she noticed rash approximately 5 days ago.

## 2016-06-04 NOTE — ED Provider Notes (Signed)
[facility]-EMERGENCY DEPT Provider Note   CSN: [id number] Arrival date & time: 06/04/16  0303    History   Chief Complaint Chief Complaint  Patient presents with  . Rash    HPI Monica Bridges is a 26 y.o. female.  26 year old female presents to the emergency department for evaluation of a rash to her left low back and leg. Patient states that symptoms began 5 days ago. She has noticed some increased aching discomfort at the site of her rash. She states achiness was preceded by an itching discomfort. Patient has tried applying topical hydrocortisone without improvement. She decided to seek evaluation today after she began to experience generalized malaise and fatigue with subjective low-grade fever. She did take Tylenol for the symptoms prior to arrival which provided some relief. She has not had any difficulty speaking or swallowing. No shortness of breath. Patient denies recent antibiotic use. She has not had any changes in soaps, lotions, or detergents. No suspicious food ingestion. Patient reports being vaccinated for varicella as a child.   The history is provided by the patient. No language interpreter was used.    Past Medical History:  Diagnosis Date  . 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 Depression   . 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 OSA (obstructive sleep apnea)   . STD (sexually transmitted disease) 09/08/2015   Chlamydia     Patient Active Problem List   Diagnosis Date Noted  . History of abnormal Pap smear 07/26/2012  . Depression     Past Surgical History:  Procedure Laterality Date  . COLPOSCOPY  7/12   CIN I  . WISDOM TOOTH EXTRACTION      OB History    Gravida Para Term Preterm AB Living   0 0 0 0 0 0   SAB TAB Ectopic Multiple Live Births   0 0 0 0         Home Medications    Prior to Admission medications   Medication Sig Start Date End Date Taking? Authorizing Provider    hydrOXYzine (ATARAX/VISTARIL) 25 MG tablet Take 1 tablet (25 mg total) by mouth every 6 (six) hours as needed for itching. 06/04/16   Antony Madura, PA-C  ibuprofen (ADVIL,MOTRIN) 600 MG tablet Take 1 tablet (600 mg total) by mouth every 6 (six) hours as needed. 06/04/16   Antony Madura, PA-C  Levonorgestrel (SKYLA) 13.5 MG IUD by Intrauterine route.    Historical Provider, MD    Family History Family History  Problem Relation Age of Onset  . Cancer Maternal Aunt     Lung    Social History Social History  Substance Use Topics  . Smoking status: Light Tobacco Smoker    Packs/day: 0.25    Types: Cigarettes  . Smokeless tobacco: Never Used  . Alcohol use 0.6 oz/week    1 Cans of beer per week     Allergies   Patient has no known allergies.   Review of Systems Review of Systems Ten systems reviewed and are negative for acute change, except as noted in the HPI.    Physical Exam Updated Vital Signs BP (!) 146/95 (BP Location: Left Arm)   Pulse 78   Temp 97.7 F (36.5 C) (Oral)   Resp 18   Ht  (1.803 m)   Wt 90.7 kg   SpO2 100%   BMI 27.89 kg/m   Physical Exam  Constitutional: She is  oriented to person, place, and time. She appears well-developed and well-nourished. No distress.  Nontoxic and in NAD  HENT:  Head: Normocephalic and atraumatic.  No angioedema. Patient tolerating secretions without difficulty.  Eyes: Conjunctivae and EOM are normal. No scleral icterus.  Neck: Normal range of motion.  No stridor  Pulmonary/Chest: Effort normal. No respiratory distress.  Respirations even and unlabored  Musculoskeletal: Normal range of motion.  Neurological: She is alert and oriented to person, place, and time.  Skin: Skin is warm and dry. Rash noted. She is not diaphoretic. No erythema. No pallor.  Maculopapular rash with superficial crusting and scabbing to the L low back extending to the L mid/lower thigh. Rash appears to correspond to the L3 dermatome. It does not  cross midline. No active weeping or purulence. No heat to touch. See image below.  Psychiatric: She has a normal mood and affect. Her behavior is normal.  Nursing note and vitals reviewed.    ED Treatments / Results  Labs (all labs ordered are listed, but only abnormal results are displayed) Labs Reviewed  HIV ANTIBODY (ROUTINE TESTING)    EKG  EKG Interpretation None       Radiology No results found.  Procedures Procedures (including critical care time)  Medications Ordered in ED Medications - No data to display       Initial Impression / Assessment and Plan / ED Course  I have reviewed the triage vital signs and the nursing notes.  Pertinent labs & imaging results that were available during my care of the patient were reviewed by me and considered in my medical decision making (see chart for details).      26 year old female presents to the emergency department for evaluation of rash. Physical exam findings are suspicious for shingles. HIV tests initially ordered for screening, though patient states that she had a negative HIV test 8 months ago. She denies any changes to her sexual history since this time or other potential blood-borne exposure.  Patient is afebrile today with reassuring vital signs. Have discussed that antivirals will likely not be effective given onset of symptoms 5 days ago. Will provide Atarax for itching. Ibuprofen advised for discomfort. Return precautions provided at discharge. Patient agreeable to plan with no unaddressed concerns; discharged in stable condition.   Final Clinical Impressions(s) / ED Diagnoses   Final diagnoses:  Herpes zoster without complication    New Prescriptions New Prescriptions   HYDROXYZINE (ATARAX/VISTARIL) 25 MG TABLET    Take 1 tablet (25 mg total) by mouth every 6 (six) hours as needed for itching.   IBUPROFEN (ADVIL,MOTRIN) 600 MG TABLET    Take 1 tablet (600 mg total) by mouth every 6 (six) hours as  needed.     Antony Madura, PA-C 06/04/16 1191    Lorre Nick, MD 06/07/16 [phone]

## 2016-08-25 ENCOUNTER — Telehealth: Payer: Self-pay | Admitting: Nurse Practitioner

## 2016-08-25 NOTE — Telephone Encounter (Signed)
Left patient a message to call back to reschedule a future appointment that was cancelled by the provider. °

## 2016-09-06 ENCOUNTER — Ambulatory Visit: Payer: No Typology Code available for payment source | Admitting: Nurse Practitioner

## 2016-09-07 ENCOUNTER — Other Ambulatory Visit (HOSPITAL_COMMUNITY)
Admission: RE | Admit: 2016-09-07 | Discharge: 2016-09-07 | Disposition: A | Discharge status: Home / Self Care | Payer: BLUE CROSS/BLUE SHIELD | Source: Ambulatory Visit | Attending: Obstetrics & Gynecology | Admitting: Obstetrics & Gynecology

## 2016-09-07 ENCOUNTER — Ambulatory Visit (INDEPENDENT_AMBULATORY_CARE_PROVIDER_SITE_OTHER): Payer: BLUE CROSS/BLUE SHIELD | Admitting: Certified Nurse Midwife

## 2016-09-07 ENCOUNTER — Encounter: Payer: Self-pay | Admitting: Certified Nurse Midwife

## 2016-09-07 VITALS — BP 104/60 | HR 68 | Resp 16 | Ht 70.25 in | Wt 198.0 lb

## 2016-09-07 DIAGNOSIS — Z0000 Encounter for general adult medical examination without abnormal findings: Secondary | ICD-10-CM

## 2016-09-07 DIAGNOSIS — Z30431 Encounter for routine checking of intrauterine contraceptive device: Secondary | ICD-10-CM | POA: Diagnosis not present

## 2016-09-07 DIAGNOSIS — Z01419 Encounter for gynecological examination (general) (routine) without abnormal findings: Secondary | ICD-10-CM | POA: Diagnosis not present

## 2016-09-07 NOTE — Patient Instructions (Signed)

## 2016-09-07 NOTE — Progress Notes (Signed)
26 y.o. G0P0000 Single  Caucasian Fe here for annual exam. Periods scant to none with Skyla IUD. Sexually active, no partner change. Had blood with sexual activity  3 days ago, scant amount and resolved. Would like to have STD screening if felt needed.   No LMP recorded. Patient is not currently having periods (Reason: IUD).          Sexually active: Yes.    The current method of family planning is IUD.    Exercising: Yes.    cardio Smoker:  no  Health Maintenance: Pap:  09-02-14 neg HPV HR+, 09-06-15 neg HPV HR neg History of Abnormal Pap: yes MMG:  none Self Breast exams: yes Colonoscopy:  none BMD:   none TDaP:  2015 Shingles: no Pneumonia: no Hep C and HIV: HIV neg 2017, [facility] c [facility] but additional testing was neg Labs: none   reports that she has been smoking Cigarettes.  She has been smoking about 0.25 packs per day. She has never used smokeless tobacco. She reports that she drinks about 0.6 oz of alcohol per week . She reports that she uses drugs, including Marijuana.  Past Medical History:  Diagnosis Date  . 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 Depression   . 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 OSA (obstructive sleep apnea)   . STD (sexually transmitted disease) 09/08/2015   Chlamydia     Past Surgical History:  Procedure Laterality Date  . COLPOSCOPY  7/12   CIN I  . WISDOM TOOTH EXTRACTION      Current Outpatient Prescriptions  Medication Sig Dispense Refill  . hydrOXYzine (ATARAX/VISTARIL) 25 MG tablet Take 1 tablet (25 mg total) by mouth every 6 (six) hours as needed for itching. 12 tablet 0  . ibuprofen (ADVIL,MOTRIN) 600 MG tablet Take 1 tablet (600 mg total) by mouth every 6 (six) hours as needed. 30 tablet 0  . Levonorgestrel (SKYLA) 13.5 MG IUD by Intrauterine route.     No current facility-administered medications for this visit.     Family History  Problem Relation Age  of Onset  . Cancer Maternal Aunt        Lung    ROS:  Pertinent items are noted in HPI.  Otherwise, a comprehensive ROS was negative.  Exam:   There were no vitals taken for this visit.   Ht Readings from Last 3 Encounters:  06/04/16 5\' 11"  (1.803 m)  03/13/16 5\' 10"  (1.778 m)  01/28/16 5\' 10"  (1.778 m)    General appearance: alert, cooperative and appears stated age Head: Normocephalic, without obvious abnormality, atraumatic Neck: no adenopathy, supple, symmetrical, trachea midline and thyroid normal to inspection and palpation Lungs: clear to auscultation bilaterally Breasts: normal appearance, no masses or tenderness, No nipple retraction or dimpling, No nipple discharge or bleeding, No axillary or supraclavicular adenopathy Heart: regular rate and rhythm Abdomen: soft, non-tender; no masses,  no organomegaly Extremities: extremities normal, atraumatic, no cyanosis or edema Skin: Skin color, texture, turgor normal. No rashes or lesions Lymph nodes: Cervical, supraclavicular, and axillary nodes normal. No abnormal inguinal nodes palpated Neurologic: Grossly normal   Pelvic: External genitalia:  no lesions              Urethra:  normal appearing urethra with no masses, tenderness or lesions              Bartholin's and Skene's: normal  Vagina: normal appearing vagina with normal color and discharge, no lesions              Cervix: no cervical motion tenderness, no lesions and nulliparous appearance IUD string noted in cervix, slight bleeding with collection of specimens              Pap taken: No. Bimanual Exam:  Uterus:  normal size, contour, position, consistency, mobility, non-tender              Adnexa: normal adnexa and no mass, fullness, tenderness               Rectovaginal: Confirms               Anus:  normal sphincter tone, no lesions  Chaperone present: yes  A:  Well Woman with normal exam  Contraception Skyla IUD due for removal 10/29/17  R/O  vaginal infection  Previous chlamydia infection treated with negativ TOC  Screening labs  P:   Reviewed health and wellness pertinent to exam  Warning signs with IUD reviewed and need to advise if present.  Labs: GC/Chlamydia, Yeast, BV, trich.  Pap smear: no  counseled on breast self exam, STD prevention, HIV risk factors and prevention, adequate intake of calcium and vitamin D, diet and exercise return annually or prn  An After Visit Summary was printed and given to the patient.

## 2016-09-08 LAB — CERVICOVAGINAL ANCILLARY ONLY
Bacterial vaginitis: NEGATIVE
Candida vaginitis: NEGATIVE
Chlamydia: NEGATIVE
Neisseria Gonorrhea: NEGATIVE
Trichomonas: NEGATIVE

## 2016-12-08 ENCOUNTER — Emergency Department (HOSPITAL_BASED_OUTPATIENT_CLINIC_OR_DEPARTMENT_OTHER)
Admission: EM | Admit: 2016-12-08 | Discharge: 2016-12-08 | Disposition: A | Discharge status: Home / Self Care | Payer: BLUE CROSS/BLUE SHIELD | Attending: Emergency Medicine | Admitting: Emergency Medicine

## 2016-12-08 ENCOUNTER — Encounter (HOSPITAL_BASED_OUTPATIENT_CLINIC_OR_DEPARTMENT_OTHER): Payer: Self-pay

## 2016-12-08 ENCOUNTER — Telehealth: Payer: Self-pay | Admitting: Certified Nurse Midwife

## 2016-12-08 DIAGNOSIS — D72829 Elevated white blood cell count, unspecified: Secondary | ICD-10-CM | POA: Diagnosis not present

## 2016-12-08 DIAGNOSIS — Z975 Presence of (intrauterine) contraceptive device: Secondary | ICD-10-CM | POA: Diagnosis not present

## 2016-12-08 DIAGNOSIS — F17210 Nicotine dependence, cigarettes, uncomplicated: Secondary | ICD-10-CM | POA: Diagnosis not present

## 2016-12-08 DIAGNOSIS — R1084 Generalized abdominal pain: Secondary | ICD-10-CM | POA: Diagnosis not present

## 2016-12-08 DIAGNOSIS — R1031 Right lower quadrant pain: Secondary | ICD-10-CM | POA: Diagnosis not present

## 2016-12-08 DIAGNOSIS — R1030 Lower abdominal pain, unspecified: Secondary | ICD-10-CM | POA: Diagnosis not present

## 2016-12-08 DIAGNOSIS — R197 Diarrhea, unspecified: Secondary | ICD-10-CM | POA: Insufficient documentation

## 2016-12-08 DIAGNOSIS — R1032 Left lower quadrant pain: Secondary | ICD-10-CM | POA: Diagnosis not present

## 2016-12-08 LAB — WET PREP, GENITAL
Clue Cells Wet Prep HPF POC: NONE SEEN
Sperm: NONE SEEN
Trich, Wet Prep: NONE SEEN
Yeast Wet Prep HPF POC: NONE SEEN

## 2016-12-08 LAB — CBC WITH DIFFERENTIAL/PLATELET
Basophils Absolute: 0 10*3/uL (ref 0.0–0.1)
Basophils Relative: 0 %
Eosinophils Absolute: 0 10*3/uL (ref 0.0–0.7)
Eosinophils Relative: 0 %
HCT: 40.3 % (ref 36.0–46.0)
Hemoglobin: 13.8 g/dL (ref 12.0–15.0)
Lymphocytes Relative: 7 %
Lymphs Abs: 0.8 10*3/uL (ref 0.7–4.0)
MCH: 32.5 pg (ref 26.0–34.0)
MCHC: 34.2 g/dL (ref 30.0–36.0)
MCV: 95 fL (ref 78.0–100.0)
Monocytes Absolute: 1.1 10*3/uL — ABNORMAL HIGH (ref 0.1–1.0)
Monocytes Relative: 10 %
Neutro Abs: 8.8 10*3/uL — ABNORMAL HIGH (ref 1.7–7.7)
Neutrophils Relative %: 83 %
Platelets: 210 10*3/uL (ref 150–400)
RBC: 4.24 MIL/uL (ref 3.87–5.11)
RDW: 12 % (ref 11.5–15.5)
WBC: 10.7 10*3/uL — ABNORMAL HIGH (ref 4.0–10.5)

## 2016-12-08 LAB — COMPREHENSIVE METABOLIC PANEL
ALT: 15 U/L (ref 14–54)
AST: 16 U/L (ref 15–41)
Albumin: 4.2 g/dL (ref 3.5–5.0)
Alkaline Phosphatase: 60 U/L (ref 38–126)
Anion gap: 10 (ref 5–15)
BUN: 8 mg/dL (ref 6–20)
CO2: 22 mmol/L (ref 22–32)
Calcium: 8.7 mg/dL — ABNORMAL LOW (ref 8.9–10.3)
Chloride: 102 mmol/L (ref 101–111)
Creatinine, Ser: 0.69 mg/dL (ref 0.44–1.00)
GFR calc Af Amer: >60 mL/min (ref >60)
GFR calc non Af Amer: >60 mL/min (ref >60)
Glucose, Bld: 96 mg/dL (ref 65–99)
Potassium: 3.3 mmol/L — ABNORMAL LOW (ref 3.5–5.1)
Sodium: 134 mmol/L — ABNORMAL LOW (ref 135–145)
Total Bilirubin: 0.9 mg/dL (ref 0.3–1.2)
Total Protein: 7.6 g/dL (ref 6.5–8.1)

## 2016-12-08 LAB — LIPASE, BLOOD: Lipase: 21 U/L (ref 11–51)

## 2016-12-08 LAB — URINALYSIS, MICROSCOPIC (REFLEX)

## 2016-12-08 LAB — URINALYSIS, ROUTINE W REFLEX MICROSCOPIC
Bilirubin Urine: NEGATIVE
Glucose, UA: NEGATIVE mg/dL
Ketones, ur: 15 mg/dL — AB
Leukocytes, UA: NEGATIVE
Nitrite: NEGATIVE
Protein, ur: NEGATIVE mg/dL
Specific Gravity, Urine: <1.005 — ABNORMAL LOW (ref 1.005–1.030)
pH: 6 (ref 5.0–8.0)

## 2016-12-08 LAB — PREGNANCY, URINE: Preg Test, Ur: NEGATIVE

## 2016-12-08 NOTE — Telephone Encounter (Signed)
I recommend a pregnancy test.  Have her complete her evaluation with her PCP and contact us back if she needs anything further.

## 2016-12-08 NOTE — ED Notes (Signed)
ED Provider at bedside. 

## 2016-12-08 NOTE — Telephone Encounter (Signed)
Spoke with patient. Patient reports bilateral lower abdominal pain that started 5 days ago. Reports as intermittent. Thought this may be diet related, adjusted diet to BRAT/bland with no changes.   Has taken OTC gasX reports no changes, LBM 5 days ago.  Taking Aleve x2 tabs q12hrs. Pain initially 10/10, now 4/10.  IUD Skyla in place, placed 10/2014. Reports no menses with IUD, occasional spotting.  Denies fever, N/V, vaginal discharge or urinary complaints.   Patient states she is currently at PCP for evaluation. Advised patient to continue with evaluation at PCP, can schedule f/u with GYN if needed.  Will review with Dr. Edward Jolly and return call with any additional recommendations. Patient verbalizes understanding and is agreeable.   Dr. Edward Jolly -please review and advise?  Cc: Leota Sauers, CNM

## 2016-12-08 NOTE — Discharge Instructions (Signed)
Make sure to drink plenty of water to replace what you are losing with diarrhea. We do not like to treat infectious diarrhea with medicine if you can tolerate it so that your body can clear the infection. Please follow-up with your primary care provider on Monday if your diarrhea is continuing. Please return to the emergency department if you develop any new or worsening symptoms including return of severe abdominal pain, fever over 100.4, intractable vomiting, or any other new or concerning symptoms. A culture will be sent of your urine. You will be called if antibiotic is indicated for urinary tract infection. You will also be called in 3 days if you're positive for any sexual transmitted disease. If positive, please follow-up with your doctor or to the health department for treatment.

## 2016-12-08 NOTE — Telephone Encounter (Signed)
Patient having severe lower abdominal pain.

## 2016-12-08 NOTE — ED Provider Notes (Signed)
[facility] Provider Note   CSN: [id number] Arrival date & time: 12/08/16  1540     History   Chief Complaint Chief Complaint  Patient presents with  . Abdominal Pain    HPI Monica Bridges is a 26 y.o. female who reports a 5 day history of watery diarrhea and lower abdominal pain. Patient reports her abdominal pain has been bilateral, but more frequently on the left side. She describes it as a dairy severe cramp, worse than menstrual cramps. She states she has short relief after a bowel movement, but it comes back fairly quickly. She has been taking Gas-X, Aleve with some relief, however her pain returns after the medication wears off. She has not had any nausea or vomiting. She denies any urinary symptoms, vaginal bleeding, discharge. Patient has an IUD. She is sexually active. She was seen by her PCP today and was sent for further evaluation because she had an elevated white blood cell count. She denies any fevers, however did feel chills earlier today. She denies any cough, chest pain, shortness of breath, bloody stools. She also denies any recent travel.  HPI  Past Medical History:  Diagnosis Date  . 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 Depression   . 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 OSA (obstructive sleep apnea)   . STD (sexually transmitted disease) 09/08/2015   Chlamydia     Patient Active Problem List   Diagnosis Date Noted  . History of abnormal Pap smear 07/26/2012  . Depression     Past Surgical History:  Procedure Laterality Date  . COLPOSCOPY  7/12   CIN I  . skyla     inserted 10-30-14  . WISDOM TOOTH EXTRACTION      OB History    Gravida Para Term Preterm AB Living   0 0 0 0 0 0   SAB TAB Ectopic Multiple Live Births   0 0 0 0         Home Medications    Prior to Admission medications   Medication Sig Start Date End Date Taking? Authorizing Provider    Levonorgestrel (SKYLA) 13.5 MG IUD by Intrauterine route.    [provider]    Family History Family History  Problem Relation Age of Onset  . Cancer Maternal Aunt        Lung    Social History Social History  Substance Use Topics  . Smoking status: Light Tobacco Smoker    Packs/day: 0.25    Types: Cigarettes  . Smokeless tobacco: Never Used     Comment: 3-4 a day  . Alcohol use Yes     Comment: weekly     Allergies   Patient has no known allergies.   Review of Systems Review of Systems  Constitutional: Positive for chills. Negative for fever.  HENT: Negative for facial swelling and sore throat.   Respiratory: Negative for shortness of breath.   Cardiovascular: Negative for chest pain.  Gastrointestinal: Positive for abdominal pain, blood in stool and diarrhea. Negative for nausea and vomiting.  Genitourinary: Negative for decreased urine volume, dysuria, vaginal bleeding and vaginal discharge.  Musculoskeletal: Negative for back pain.  Skin: Negative for rash and wound.  Neurological: Negative for headaches.  Psychiatric/Behavioral: The patient is not nervous/anxious.      Physical Exam Updated Vital Signs BP 115/87   Pulse 78   Temp 98.2 F (36.8 C) (Oral)  Resp 16   Ht  (1.803 m)   Wt 90.9 kg (200 lb 6.4 oz)   SpO2 100%   BMI 27.95 kg/m   Physical Exam  Constitutional: She appears well-developed and well-nourished. No distress.  HENT:  Head: Normocephalic and atraumatic.  Mouth/Throat: Oropharynx is clear and moist. No oropharyngeal exudate.  Eyes: Pupils are equal, round, and reactive to light. Conjunctivae are normal. Right eye exhibits no discharge. Left eye exhibits no discharge. No scleral icterus.  Neck: Normal range of motion. Neck supple. No thyromegaly present.  Cardiovascular: Normal rate, regular rhythm, normal heart sounds and intact distal pulses.  Exam reveals no gallop and no friction rub.   No murmur  heard. Pulmonary/Chest: Effort normal and breath sounds normal. No stridor. No respiratory distress. She has no wheezes. She has no rales.  Abdominal: Soft. Bowel sounds are normal. She exhibits no distension. There is tenderness (mild) in the suprapubic area and left lower quadrant. There is no rebound, no guarding and no CVA tenderness.  Musculoskeletal: She exhibits no edema.  Lymphadenopathy:    She has no cervical adenopathy.  Neurological: She is alert. Coordination normal.  Skin: Skin is warm and dry. No rash noted. She is not diaphoretic. No pallor.  Psychiatric: She has a normal mood and affect.  Nursing note and vitals reviewed.    ED Treatments / Results  Labs (all labs ordered are listed, but only abnormal results are displayed) Labs Reviewed  WET PREP, GENITAL - Abnormal; Notable for the following:       Result Value   WBC, Wet Prep HPF POC MANY (*)    All other components within normal limits  URINALYSIS, ROUTINE W REFLEX MICROSCOPIC - Abnormal; Notable for the following:    Specific Gravity, Urine <1.005 (*)    Hgb urine dipstick SMALL (*)    Ketones, ur 15 (*)    All other components within normal limits  URINALYSIS, MICROSCOPIC (REFLEX) - Abnormal; Notable for the following:    Bacteria, UA MANY (*)    Squamous Epithelial / LPF 0-5 (*)    All other components within normal limits  COMPREHENSIVE METABOLIC PANEL - Abnormal; Notable for the following:    Sodium 134 (*)    Potassium 3.3 (*)    Calcium 8.7 (*)    All other components within normal limits  CBC WITH DIFFERENTIAL/PLATELET - Abnormal; Notable for the following:    WBC 10.7 (*)    Neutro Abs 8.8 (*)    Monocytes Absolute 1.1 (*)    All other components within normal limits  URINE CULTURE  PREGNANCY, URINE  LIPASE, BLOOD  GC/CHLAMYDIA PROBE AMP ([place]) NOT AT [facility]    EKG  EKG Interpretation None       Radiology No results found.  Procedures Procedures (including critical care  time)  Medications Ordered in ED Medications - No data to display   Initial Impression / Assessment and Plan / ED Course  I have reviewed the triage vital signs and the nursing notes.  Pertinent labs & imaging results that were available during my care of the patient were reviewed by me and considered in my medical decision making (see chart for details).     CBC shows WBC 10.7. CMP shows sodium 134, potassium 3.3. Lipase 21. Patient nontender on exam. Patient without pain while in the ED. GC/chlamydia sent. Wet prep shows many WBCs, however otherwise negative. UA shows many bacteria, small hematuria. Urine culture sent and would treat if  positive. Urine pregnancy negative. We'll discharge home with follow-up to PCP. Strict return precautions discussed. Patient understands and agrees with plan. Patient vitals stable throughout ED course and discharged in satisfactory condition. Patient also evaluated by Dr. Criss Alvine that at the patient's management and agrees with plan.  Final Clinical Impressions(s) / ED Diagnoses   Final diagnoses:  Lower abdominal pain  Diarrhea, unspecified type    New Prescriptions Discharge Medication List as of 12/08/2016  6:43 PM       Emi Holes, PA-C 12/09/16 0107    Pricilla Loveless, MD 12/10/16 1724

## 2016-12-08 NOTE — ED Triage Notes (Signed)
C/o abd pain, diarrhea x 5 days-sent from PCP-NAD-steady gait

## 2016-12-08 NOTE — Telephone Encounter (Signed)
Left detailed message, ok per current dpr. Advised as seen below per Dr. Edward Jolly. If any additional questions, return call to office at [phone].   Will close encounter.

## 2016-12-10 LAB — URINE CULTURE

## 2016-12-11 LAB — GC/CHLAMYDIA PROBE AMP (~~LOC~~) NOT AT ARMC
Chlamydia: NEGATIVE
Neisseria Gonorrhea: NEGATIVE

## 2017-08-06 ENCOUNTER — Telehealth: Payer: Self-pay | Admitting: Certified Nurse Midwife

## 2017-08-06 MED ORDER — MISOPROSTOL 200 MCG PO TABS: ORAL_TABLET | ORAL | 0 refills | Status: DC

## 2017-08-06 NOTE — Telephone Encounter (Signed)
Patient called requesting to know the date when her IUD is due to be exchanged.

## 2017-08-06 NOTE — Telephone Encounter (Signed)
Spoke with patient. Advised IUD is due to be exchanged by 10/29/2017. Patient would like to proceed with scheduling. Appointment scheduled for 10/22/2017 at 2 pm with Dr.Silva. Pre procedure instructions given.  Motrin instructions given. Motrin=Advil=Ibuprofen, 800 mg one hour before appointment. Eat a meal and hydrate well before appointment. Cytotec instructions given. Place 1 tablet PV night before the procedure. Place 1 tablet PV the morning of the procedure. Rx for Cytotec 200 mcg #2 0RF sent to pharmacy on file. Patient verbalizes understanding. Patient has aex on 09/11/17 with Leota Sauerseborah Leonard CNM.  Routing to provider for final review. Patient agreeable to disposition. Will close encounter.

## 2017-08-29 ENCOUNTER — Telehealth: Payer: Self-pay | Admitting: Certified Nurse Midwife

## 2017-08-29 ENCOUNTER — Ambulatory Visit: Payer: BLUE CROSS/BLUE SHIELD | Admitting: Obstetrics and Gynecology

## 2017-08-29 ENCOUNTER — Other Ambulatory Visit: Payer: Self-pay

## 2017-08-29 ENCOUNTER — Encounter: Payer: Self-pay | Admitting: Obstetrics and Gynecology

## 2017-08-29 ENCOUNTER — Other Ambulatory Visit (HOSPITAL_COMMUNITY)
Admission: RE | Admit: 2017-08-29 | Discharge: 2017-08-29 | Disposition: A | Discharge status: Home / Self Care | Payer: BLUE CROSS/BLUE SHIELD | Source: Ambulatory Visit | Attending: Obstetrics and Gynecology | Admitting: Obstetrics and Gynecology

## 2017-08-29 VITALS — BP 98/60 | HR 64 | Resp 14 | Ht 70.25 in | Wt 168.0 lb

## 2017-08-29 DIAGNOSIS — R102 Pelvic and perineal pain: Secondary | ICD-10-CM | POA: Insufficient documentation

## 2017-08-29 LAB — POCT URINALYSIS DIPSTICK
Bilirubin, UA: NEGATIVE
Blood, UA: NEGATIVE
Glucose, UA: NEGATIVE
Ketones, UA: NEGATIVE
Leukocytes, UA: NEGATIVE
Nitrite, UA: NEGATIVE
Protein, UA: NEGATIVE
Urobilinogen, UA: 0.2 E.U./dL
pH, UA: 5 (ref 5.0–8.0)

## 2017-08-29 LAB — POCT URINE PREGNANCY: Preg Test, Ur: NEGATIVE

## 2017-08-29 NOTE — Telephone Encounter (Signed)
Patient left voicemail stating that she was having abdominal pain, and that it could possibly be associated with her IUD.

## 2017-08-29 NOTE — Telephone Encounter (Signed)
Patient returned call. Patient states that she is due to have Skyla exchanged at the end of August. States for the last 2 days, she has had nausea and abdominal cramping following intercourse. Last intercourse this am and no partner change. Denies fever. States she has spotting every now and then with Skyla, but no regular periods. RN advised patient would need to be evaluated in office. Patient agreeable. Per Kandis CockingS. Yeakley, RN, patient can come to office now for work in appointment. Patient updated and will come to office now. Aware appointment will be a work in, so may have extended wait time. Patient states, "that's okay she is just appreciative to been seen."   Routing to provider for final review. Patient agreeable to disposition. Will close encounter.

## 2017-08-29 NOTE — Progress Notes (Signed)
GYNECOLOGY  VISIT   HPI: 27 y.o.   Single  Caucasian  female   G0P0000 with No LMP recorded. (Menstrual status: IUD).here for pain with IUD     For 2 days having severe abdominal cramping after intercourse.  Yesterday subsided after one hour.   No regular menses so hard to track her cycle and premenstrual period of time.  Today had cramping again after intercourse and low back pain.  Some nausea with the pain.  No vomiting.  No fevers.  No diarrhea.  Partner stated she had some mild bleeding after intercourse yesterday.  No dysuria but some discomfort with after intercourse so hard to tell.   No unusual vaginal discharge.   Hx chlamydia.   GYNECOLOGIC HISTORY: No LMP recorded. (Menstrual status: IUD). Contraception:  Skyla IUD inserted 10/30/14 Menopausal hormone therapy:  none Last mammogram:  n/a Last pap smear:   09-06-15 neg HPV HR neg. Pap in 2016 - negative and positive HR HPV.  Colpo 09/22/14 - LGSIL.         OB History    Gravida  0   Para  0   Term  0   Preterm  0   AB  0   Living  0     SAB  0   TAB  0   Ectopic  0   Multiple  0   Live Births                 Patient Active Problem List   Diagnosis Date Noted  . History of abnormal Pap smear 07/26/2012  . Depression     Past Medical History:  Diagnosis Date  . 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 Depression   . 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 OSA (obstructive sleep apnea)   . STD (sexually transmitted disease) 09/08/2015   Chlamydia     Past Surgical History:  Procedure Laterality Date  . COLPOSCOPY  7/12   CIN I  . skyla     inserted 10-30-14  . WISDOM TOOTH EXTRACTION      Current Outpatient Medications  Medication Sig Dispense Refill  . Levonorgestrel (SKYLA) 13.5 MG IUD by Intrauterine route.     No current facility-administered medications for this visit.      ALLERGIES: Patient has no known  allergies.  Family History  Problem Relation Age of Onset  . Cancer Maternal Aunt        Lung    Social History   Socioeconomic History  . Marital status: Single    Spouse name: Not on file  . Number of children: 0  . Years of education: College  . Highest education level: Not on file  Occupational History  . Occupation: [employer]  Social Needs  . Financial resource strain: Not on file  . Food insecurity:    Worry: Not on file    Inability: Not on file  . Transportation needs:    Medical: Not on file    Non-medical: Not on file  Tobacco Use  . Smoking status: Light Tobacco Smoker    Packs/day: 0.25    Types: Cigarettes  . Smokeless tobacco: Never Used  . Tobacco comment: 3-4 a day  Substance and Sexual Activity  . Alcohol use: Yes    Comment: weekly  . Drug use: Not on file    Comment: occ  . Sexual activity: Yes  Partners: Male    Birth control/protection: IUD    Comment: Skyla inserted 10-30-14  Lifestyle  . Physical activity:    Days per week: Not on file    Minutes per session: Not on file  . Stress: Not on file  Relationships  . Social connections:    Talks on phone: Not on file    Gets together: Not on file    Attends religious service: Not on file    Active member of club or organization: Not on file    Attends meetings of clubs or organizations: Not on file    Relationship status: Not on file  . Intimate partner violence:    Fear of current or ex partner: Not on file    Emotionally abused: Not on file    Physically abused: Not on file    Forced sexual activity: Not on file  Other Topics Concern  . Not on file  Social History Narrative   3-4 energy drinks a week     Review of Systems  Constitutional: Negative.   HENT: Negative.   Eyes: Negative.   Respiratory: Negative.   Cardiovascular: Negative.   Gastrointestinal: Negative.   Endocrine: Negative.   Genitourinary:       Pain after intercourse  Musculoskeletal: Negative.   Skin:  Negative.   Allergic/Immunologic: Negative.   Neurological: Negative.   Hematological: Negative.   Psychiatric/Behavioral: Negative.     PHYSICAL EXAMINATION:    BP 98/60 (BP Location: Right Arm, Patient Position: Sitting, Cuff Size: Normal)   Pulse 64   Resp 14   Ht 5' 10.25" (1.784 m)   Wt 168 lb (76.2 kg)   BMI 23.93 kg/m     General appearance: alert, cooperative and appears stated age   Abdomen: soft, non-tender, no masses,  no organomegaly   Pelvic: External genitalia:  no lesions              Urethra:  normal appearing urethra with no masses, tenderness or lesions              Bartholins and Skenes: normal                 Vagina: normal appearing vagina with normal color and discharge, no lesions              Cervix: no lesions.  IUD strings noted.                 Bimanual Exam:  Uterus:  normal size, contour, position, consistency, mobility, non-tender              Adnexa: no mass, fullness, tenderness            Chaperone was present for exam.  ASSESSMENT  Pelvic pain. No acute abdomen. IUD patient.  Hx chlamydia.  Amenorrhea. Hx LGSIL in 2016.  Normal pap in 2017.   PLAN  Urine dip - neg. UPT- neg. GC/CT/trich testing.  Ibuprofen 800 mg every 8 hours for 24 hours. Avoid intercourse for 24 hours.  [place] if pain persists.  We talked about Rutha Bouchard for the future.  Will need pap with annual exam this year.   An After Visit Summary was printed and given to the patient.  ___15___ minutes face to face time of which over 50% was spent in counseling.

## 2017-08-29 NOTE — Telephone Encounter (Signed)
Message left to return call to the Triage Nurse at [phone].

## 2017-08-31 LAB — CERVICOVAGINAL ANCILLARY ONLY
Chlamydia: NEGATIVE
Neisseria Gonorrhea: NEGATIVE
Trichomonas: NEGATIVE

## 2017-09-11 ENCOUNTER — Ambulatory Visit: Payer: BLUE CROSS/BLUE SHIELD | Admitting: Certified Nurse Midwife

## 2017-09-11 ENCOUNTER — Encounter: Payer: Self-pay | Admitting: Certified Nurse Midwife

## 2017-09-11 ENCOUNTER — Other Ambulatory Visit (HOSPITAL_COMMUNITY)
Admission: RE | Admit: 2017-09-11 | Discharge: 2017-09-11 | Disposition: A | Discharge status: Home / Self Care | Payer: BLUE CROSS/BLUE SHIELD | Source: Ambulatory Visit | Attending: Obstetrics & Gynecology | Admitting: Obstetrics & Gynecology

## 2017-09-11 ENCOUNTER — Other Ambulatory Visit: Payer: Self-pay

## 2017-09-11 VITALS — BP 106/58 | HR 72 | Resp 12 | Ht 70.5 in | Wt 170.2 lb

## 2017-09-11 DIAGNOSIS — Z124 Encounter for screening for malignant neoplasm of cervix: Secondary | ICD-10-CM

## 2017-09-11 DIAGNOSIS — Z01419 Encounter for gynecological examination (general) (routine) without abnormal findings: Secondary | ICD-10-CM

## 2017-09-11 DIAGNOSIS — Z30431 Encounter for routine checking of intrauterine contraceptive device: Secondary | ICD-10-CM

## 2017-09-11 NOTE — Progress Notes (Signed)
27 y.o. G0P0000 Single  Caucasian Fe here for annual exam. Periods scant to none. Occasional spotting, no issues since using IUD. Occasional cramping only. Now has relationship with someone she knew and now sexually active. Had STD screening with check of IUD with Dr. Edward JollySilva on 08/29/17. All negative. Has been on a health journey with weight loss, stopped smoking, vegan diet and new relationship! No health issues today.   No LMP recorded. (Menstrual status: IUD).          Sexually active: Yes.    The current method of family planning is IUD.    Exercising: Yes.    Cardio  Smoker:  yes  Health Maintenance: Pap:  09-06-15 negative, HR HPV negative           09-02-14 negative, HR HPV positive  History of Abnormal Pap: yes MMG:  None  Self Breast exams: yes Colonoscopy:  none BMD:   none TDaP:  2015  Shingles: no Pneumonia: no Hep C and HIV: HIV negative 2017, Hep C was flagged at [facility], but additional testing was negative  Labs: discuss with provider    reports that she has been smoking cigarettes.  She has been smoking about 0.25 packs per day. She has never used smokeless tobacco. She reports that she drinks alcohol. She reports that . Drug: Marijuana.  Past Medical History:  Diagnosis Date  . 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 Depression   . 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 OSA (obstructive sleep apnea)   . STD (sexually transmitted disease) 09/08/2015   Chlamydia     Past Surgical History:  Procedure Laterality Date  . COLPOSCOPY  7/12   CIN I  . skyla     inserted 10-30-14  . WISDOM TOOTH EXTRACTION      Current Outpatient Medications  Medication Sig Dispense Refill  . Levonorgestrel (SKYLA) 13.5 MG IUD by Intrauterine route.     No current facility-administered medications for this visit.     Family History  Problem Relation Age of Onset  . Cancer Maternal Aunt        Lung    ROS:  Pertinent  items are noted in HPI.  Otherwise, a comprehensive ROS was negative.  Exam:   BP (!) 106/58 (BP Location: Right Arm, Patient Position: Sitting, Cuff Size: Normal)   Pulse 72   Resp 12   Ht 5' 10.5" (1.791 m)   Wt 170 lb 4 oz (77.2 kg)   BMI 24.08 kg/m  Height: 5' 10.5" (179.1 cm) Ht Readings from Last 3 Encounters:  09/11/17 5' 10.5" (1.791 m)  08/29/17 5' 10.25" (1.784 m)  12/08/16 5\' 11"  (1.803 m)    General appearance: alert, cooperative and appears stated age Head: Normocephalic, without obvious abnormality, atraumatic Neck: no adenopathy, supple, symmetrical, trachea midline and thyroid normal to inspection and palpation Lungs: clear to auscultation bilaterally Breasts: normal appearance, no masses or tenderness, No nipple retraction or dimpling, No nipple discharge or bleeding, No axillary or supraclavicular adenopathy Heart: regular rate and rhythm Abdomen: soft, non-tender; no masses,  no organomegaly Extremities: extremities normal, atraumatic, no cyanosis or edema Skin: Skin color, texture, turgor normal. No rashes or lesions Lymph nodes: Cervical, supraclavicular, and axillary nodes normal. No abnormal inguinal nodes palpated Neurologic: Grossly normal   Pelvic: External genitalia:  no lesions              Urethra:  normal appearing urethra with no masses, tenderness or lesions              Bartholin's and Skene's: normal                 Vagina: normal appearing vagina with normal color and discharge, no lesions              Cervix: no cervical motion tenderness, no lesions, nulliparous appearance and IUd string noted in cervical os              Pap taken: Yes.   Bimanual Exam:  Uterus:  normal size, contour, position, consistency, mobility, non-tender and anteverted              Adnexa: normal adnexa and no mass, fullness, tenderness               Rectovaginal: Confirms               Anus:  normal appearance, no lesions  Chaperone present: yes  A:  Well Woman  with normal exam  Contraception  Skyla IUD, due to replacement, has scheduled and with Kyleena replacement  Smoking cessation for 3 months  Vegan diet now  P:   Reviewed health and wellness pertinent to exam  RIsks/benefits/warning signs reviewed for IUD use. Patient plans to proceed with exchange of IUD as scheduled  Congratulated on smoking cessation and continue to progress on with not smoking.  Stressed importance of protein in vegan diet. Patient aware.  Pap smear: yes  counseled on breast self exam, STD prevention, HIV risk factors and prevention, adequate intake of calcium and vitamin D, diet and exercise  return annually or prn  An After Visit Summary was printed and given to the patient.

## 2017-09-11 NOTE — Patient Instructions (Signed)

## 2017-09-12 LAB — CYTOLOGY - PAP: Diagnosis: NEGATIVE

## 2017-09-24 ENCOUNTER — Telehealth: Payer: Self-pay | Admitting: Certified Nurse Midwife

## 2017-09-24 ENCOUNTER — Encounter: Payer: Self-pay | Admitting: Obstetrics and Gynecology

## 2017-09-24 ENCOUNTER — Other Ambulatory Visit: Payer: Self-pay

## 2017-09-24 ENCOUNTER — Ambulatory Visit: Payer: BLUE CROSS/BLUE SHIELD | Admitting: Obstetrics and Gynecology

## 2017-09-24 VITALS — BP 114/60 | HR 80 | Resp 16 | Ht 70.5 in | Wt 169.0 lb

## 2017-09-24 DIAGNOSIS — N751 Abscess of Bartholin's gland: Secondary | ICD-10-CM | POA: Diagnosis not present

## 2017-09-24 DIAGNOSIS — R197 Diarrhea, unspecified: Secondary | ICD-10-CM | POA: Diagnosis not present

## 2017-09-24 MED ORDER — SULFAMETHOXAZOLE-TRIMETHOPRIM 800-160 MG PO TABS: 1.0000 | ORAL_TABLET | Freq: Two times a day (BID) | ORAL | 0 refills | Status: DC

## 2017-09-24 NOTE — Telephone Encounter (Signed)
Patient is experiencing some vaginal swelling.

## 2017-09-24 NOTE — Progress Notes (Signed)
GYNECOLOGY  VISIT   HPI: 27 y.o.   Single  Caucasian  female   G0P0000 with No LMP recorded. (Menstrual status: IUD).here for lump on left labia since Friday or Saturday per patient. Having pain and lump has increased in size.  No draining.  No fever.   Has diarrhea that is mild.  No blood in the stool. No vomiting or nausea.  Just back from [place].  Was eating vegan prior to this but hard to follow this while on her trip.   GYNECOLOGIC HISTORY: No LMP recorded. (Menstrual status: IUD). Contraception:  Skyla IUD Menopausal hormone therapy:  n/a Last mammogram:  n/a Last pap smear:   09/11/17 Negative        OB History    Gravida  0   Para  0   Term  0   Preterm  0   AB  0   Living  0     SAB  0   TAB  0   Ectopic  0   Multiple  0   Live Births                 Patient Active Problem List   Diagnosis Date Noted  . History of abnormal Pap smear 07/26/2012  . Depression     Past Medical History:  Diagnosis Date  . 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 Depression   . 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 OSA (obstructive sleep apnea)   . STD (sexually transmitted disease) 09/08/2015   Chlamydia     Past Surgical History:  Procedure Laterality Date  . COLPOSCOPY  7/12   CIN I  . skyla     inserted 10-30-14  . WISDOM TOOTH EXTRACTION      Current Outpatient Medications  Medication Sig Dispense Refill  . Levonorgestrel (SKYLA) 13.5 MG IUD by Intrauterine route.     No current facility-administered medications for this visit.      ALLERGIES: Patient has no known allergies.  Family History  Problem Relation Age of Onset  . Cancer Maternal Aunt        Lung    Social History   Socioeconomic History  . Marital status: Single    Spouse name: Not on file  . Number of children: 0  . Years of education: College  . Highest education level: Not on file  Occupational History  .  Occupation: [employer]  Social Needs  . Financial resource strain: Not on file  . Food insecurity:    Worry: Not on file    Inability: Not on file  . Transportation needs:    Medical: Not on file    Non-medical: Not on file  Tobacco Use  . Smoking status: Light Tobacco Smoker    Packs/day: 0.25    Types: Cigarettes  . Smokeless tobacco: Never Used  . Tobacco comment: 3-4 a day  Substance and Sexual Activity  . Alcohol use: Yes    Comment: weekly  . Drug use: Not on file    Comment: occ  . Sexual activity: Yes    Partners: Male    Birth control/protection: IUD    Comment: Skyla inserted 10-30-14  Lifestyle  . Physical activity:    Days per week: Not on file    Minutes per session: Not on file  . Stress: Not on file  Relationships  . Social connections:    Talks on  phone: Not on file    Gets together: Not on file    Attends religious service: Not on file    Active member of club or organization: Not on file    Attends meetings of clubs or organizations: Not on file    Relationship status: Not on file  . Intimate partner violence:    Fear of current or ex partner: Not on file    Emotionally abused: Not on file    Physically abused: Not on file    Forced sexual activity: Not on file  Other Topics Concern  . Not on file  Social History Narrative   3-4 energy drinks a week     Review of Systems  Gastrointestinal: Positive for diarrhea.  Genitourinary:       Vulvar lump  All other systems reviewed and are negative.   PHYSICAL EXAMINATION:    BP 114/60 (BP Location: Right Arm, Patient Position: Sitting, Cuff Size: Normal)   Pulse 80   Resp 16   Ht 5' 10.5" (1.791 m)   Wt 169 lb (76.7 kg)   BMI 23.91 kg/m     General appearance: alert, cooperative and appears stated age   Pelvic: External genitalia:   3 cm left Bartholin's abscess, tender and not draining.   I and D vulvar abscess Consent for procedure. Sterile prep with betadine.  Local 1% lidocaine lot  number [id number], exp 1/23.  Incision made on medial left labia majora over the Bartholin's gland. Large amount of pus drained.  Wound culture taken.  Loculations broken up with hemostat. Minimal EBL. No complications.   Chaperone was present for exam.  ASSESSMENT  Left Bartholin's gland abscess.  Diarrhea post travel.  PLAN  FU wound culture.  Bactrim DS po bid x 7 days.  She will start probiotics and hydrate well.  She will see her PCP if the diarrhea persists.  FU in 4 weeks for IUD exchange.   An After Visit Summary was printed and given to the patient.  __15___ minutes face to face time of which over 50% was spent in counseling.

## 2017-09-24 NOTE — Telephone Encounter (Signed)
Spoke with patient. Patient reports firm lump, "grape size", left labia. Noticed lump on 7/26, has become more tender and is concerned. Denies any vaginal d/c or odor. IUD for contraceptive.   Reports diarrhea, started 1 wk ago while in [place], last episode 10 min ago. Denies fever/chills, N/V.   Recommended patient f/u with PCP today for further evaluation of diarrhea. Patient scheduled for OV with Leota Sauerseborah Leonard, CNM on 7/30 at 12:45pm.   Advised I will review with covering provider and return call with any additional recommendations, patient agreeable.   Routing to provider for final review. Patient is agreeable to disposition. Will close encounter.   Cc: Leota Sauerseborah Leonard, CNM

## 2017-09-24 NOTE — Patient Instructions (Signed)
Bartholin Cyst or Abscess A Bartholin cyst is a fluid-filled sac that forms on a Bartholin gland. Bartholin glands are small glands that are found in the folds of skin (labia) on the sides of the lower opening of the vagina. This type of cyst causes a bulge on the side of the vagina. A cyst that is not large or infected may not cause problems. However, if the fluid in the cyst becomes infected, the cyst can turn into an abscess. An abscess may cause discomfort or pain. Follow these instructions at home:  Take medicines only as told by your doctor.  If you were prescribed an antibiotic medicine, finish all of it even if you start to feel better.  Apply warm, wet compresses to the area or take warm, shallow baths that cover your pelvic area (sitz baths). Do this many times each day or as told by your doctor.  Do not squeeze the cyst. Do not apply heavy pressure to it.  Do not have sex until the cyst has gone away.  If your cyst or abscess was opened by your doctor, a small piece of gauze or a drain may have been placed in the area. That lets the cyst drain. Do not remove the gauze or the drain until your doctor tells you it is okay to do that.  Do not wear tampons. Wear feminine pads as needed for any fluid or blood.  Keep all follow-up visits as told by your doctor. This is important. Contact a doctor if:  Your pain, puffiness (swelling), or redness in the area of the cyst gets worse.  You have fluid or pus pus coming from the cyst.  You have a fever. This information is not intended to replace advice given to you by your health care provider. Make sure you discuss any questions you have with your health care provider. Document Released: 05/12/2008 Document Revised: 07/22/2015 Document Reviewed: 09/29/2013 Elsevier Interactive Patient Education  2018 Elsevier Inc.  

## 2017-09-24 NOTE — Telephone Encounter (Signed)
Spoke with patient. OV scheduled for today at 12:45pm with Dr. Edward JollySilva. OV cancelled for 7/30 with Leota Sauerseborah Leonard, CNM.   Routing to provider for final review. Patient is agreeable to disposition. Will close encounter.

## 2017-09-24 NOTE — Telephone Encounter (Signed)
I can see the patient today if she is getting more uncomfortable.

## 2017-09-25 ENCOUNTER — Ambulatory Visit: Payer: Self-pay | Admitting: Certified Nurse Midwife

## 2017-09-26 LAB — WOUND CULTURE: Organism ID, Bacteria: NONE SEEN

## 2017-09-28 ENCOUNTER — Other Ambulatory Visit: Payer: Self-pay | Admitting: *Deleted

## 2017-09-28 DIAGNOSIS — Z30430 Encounter for insertion of intrauterine contraceptive device: Secondary | ICD-10-CM

## 2017-09-28 DIAGNOSIS — Z30432 Encounter for removal of intrauterine contraceptive device: Secondary | ICD-10-CM

## 2017-10-02 ENCOUNTER — Telehealth: Payer: Self-pay | Admitting: Obstetrics and Gynecology

## 2017-10-02 NOTE — Telephone Encounter (Signed)
Call placed to convey benefits. 

## 2017-10-22 ENCOUNTER — Encounter: Payer: Self-pay | Admitting: Obstetrics and Gynecology

## 2017-10-22 ENCOUNTER — Telehealth: Payer: Self-pay | Admitting: Obstetrics and Gynecology

## 2017-10-22 ENCOUNTER — Ambulatory Visit (INDEPENDENT_AMBULATORY_CARE_PROVIDER_SITE_OTHER): Payer: BLUE CROSS/BLUE SHIELD | Admitting: Obstetrics and Gynecology

## 2017-10-22 DIAGNOSIS — Z30432 Encounter for removal of intrauterine contraceptive device: Secondary | ICD-10-CM | POA: Diagnosis not present

## 2017-10-22 DIAGNOSIS — Z30430 Encounter for insertion of intrauterine contraceptive device: Secondary | ICD-10-CM

## 2017-10-22 DIAGNOSIS — Z01812 Encounter for preprocedural laboratory examination: Secondary | ICD-10-CM | POA: Diagnosis not present

## 2017-10-22 LAB — POCT URINE PREGNANCY: Preg Test, Ur: NEGATIVE

## 2017-10-22 NOTE — Telephone Encounter (Signed)
Patient says she thought she needed to take medication before her procedure today. Please call [phone]

## 2017-10-22 NOTE — Progress Notes (Signed)
GYNECOLOGY  VISIT   HPI: 27 y.o.   Single  Caucasian  female   G0P0000 with No LMP recorded (lmp unknown). (Menstrual status: IUD).here for Skyla removal and Kyleena insertion Patient took 800mg  of Ibuprofen at about 12:45pm  Skyla IUD placed 10/30/14.   Did not take Cytotec.   GC/CT neg on 08/29/17.  Notes a firm nodule in the area of recent left vulvar abscess drainage.  No pain with intercouse.  GYNECOLOGIC HISTORY: No LMP recorded (lmp unknown). (Menstrual status: IUD). Contraception:  Skyla IUD  Menopausal hormone therapy:  n/a Last mammogram:  n/a Last pap smear:   09/11/17 Negative        OB History    Gravida  0   Para  0   Term  0   Preterm  0   AB  0   Living  0     SAB  0   TAB  0   Ectopic  0   Multiple  0   Live Births                 Patient Active Problem List   Diagnosis Date Noted  . History of abnormal Pap smear 07/26/2012  . Depression     Past Medical History:  Diagnosis Date  . 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 Depression   . 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 OSA (obstructive sleep apnea)   . STD (sexually transmitted disease) 09/08/2015   Chlamydia     Past Surgical History:  Procedure Laterality Date  . COLPOSCOPY  7/12   CIN I  . skyla     inserted 10-30-14  . WISDOM TOOTH EXTRACTION      Current Outpatient Medications  Medication Sig Dispense Refill  . Levonorgestrel (SKYLA) 13.5 MG IUD by Intrauterine route.     No current facility-administered medications for this visit.      ALLERGIES: Patient has no known allergies.  Family History  Problem Relation Age of Onset  . Cancer Maternal Aunt        Lung    Social History   Socioeconomic History  . Marital status: Single    Spouse name: Not on file  . Number of children: 0  . Years of education: College  . Highest education level: Not on file  Occupational History  . Occupation: [employer]  . Financial resource strain: Not on file  . Food insecurity:    Worry: Not on file    Inability: Not on file  . Transportation needs:    Medical: Not on file    Non-medical: Not on file  Tobacco Use  . Smoking status: Light Tobacco Smoker    Packs/day: 0.25    Types: Cigarettes  . Smokeless tobacco: Never Used  . Tobacco comment: 3-4 a day  Substance and Sexual Activity  . Alcohol use: Yes    Comment: weekly  . Drug use: Not on file    Comment: occ  . Sexual activity: Yes    Partners: Male    Birth control/protection: IUD    Comment: Skyla inserted 10-30-14  Lifestyle  . Physical activity:    Days per week: Not on file    Minutes per session: Not on file  . Stress: Not on file  Relationships  . Social connections:    Talks on phone: Not on file    Gets together:  Not on file    Attends religious service: Not on file    Active member of club or organization: Not on file    Attends meetings of clubs or organizations: Not on file    Relationship status: Not on file  . Intimate partner violence:    Fear of current or ex partner: Not on file    Emotionally abused: Not on file    Physically abused: Not on file    Forced sexual activity: Not on file  Other Topics Concern  . Not on file  Social History Narrative   3-4 energy drinks a week     Review of Systems  PHYSICAL EXAMINATION:    BP 110/60 (BP Location: Right Arm, Patient Position: Sitting, Cuff Size: Normal)   Pulse 64   Resp 14   Ht 5' 10.5" (1.791 m)   Wt 175 lb (79.4 kg)   LMP  (LMP Unknown)   BMI 24.75 kg/m     General appearance: alert, cooperative and appears stated age    Pelvic: External genitalia:   Palpable left Bartholin's gland, nontender.              Urethra:  normal appearing urethra with no masses, tenderness or lesions              Bartholins and Skenes: normal                 Vagina: normal appearing vagina with normal color and discharge, no lesions               Cervix: no lesions.  IUD strings noted.                Bimanual Exam:  Uterus:  normal size, contour, position, consistency, mobility, non-tender              Adnexa: no mass, fullness, tenderness            IUD exchange.  Consent for removal of Skyla and placement of Kyleena.  Kyleena IUD - lot [id number], exp Feb 2021.  Hibiclens prep. Ring forceps used to remove Skyla intact, shown to patient and discarded. Paracervical block with 10 cc of 1% lidocaine [id number], exp 1/23. Uterus sounded to 7 cm.  IUD placed to 7 cm. Strings trimmed to 3 cm.  Repeat BM exam, no change.  Minimal EBL.  No complications.   Chaperone was present for exam.  ASSESSMENT  Skyla removal and Kyleena IUD placement.   PLAN  Instructions and precautions given.  Back up protection for one week.  FU in 4 weeks.    An After Visit Summary was printed and given to the patient.

## 2017-10-22 NOTE — Patient Instructions (Signed)

## 2017-10-22 NOTE — Telephone Encounter (Signed)
G0P0 pt coming in today for Skyla removal, Kyleena insertion.  Does she need cytotec rx for appointment at 1400?   Did not pick up Cytotec that was sent in for her in June when appointment was scheduled.  She called [facility] and rx is not there.  Pre-procedure instructions discussed.   Advised will call back when reviewed with Dr. Edward JollySilva. Pt agreeable.

## 2017-10-22 NOTE — Telephone Encounter (Signed)
Does not need to use the Cytotec.

## 2017-10-22 NOTE — Telephone Encounter (Signed)
Pt notified. Encounter closed.

## 2017-11-19 ENCOUNTER — Telehealth: Payer: Self-pay | Admitting: Obstetrics and Gynecology

## 2017-11-19 ENCOUNTER — Ambulatory Visit: Payer: Self-pay | Admitting: Obstetrics and Gynecology

## 2017-11-19 ENCOUNTER — Encounter: Payer: Self-pay | Admitting: Obstetrics and Gynecology

## 2017-11-19 NOTE — Progress Notes (Deleted)
GYNECOLOGY  VISIT   HPI: 27 y.o.   Single  {Race/ethnicity:[id number]}  female   G0P0000 with No LMP recorded (lmp unknown). (Menstrual status: IUD).   here for  4 week recheck of IUD    GYNECOLOGIC HISTORY: No LMP recorded (lmp unknown). (Menstrual status: IUD). Contraception: Skyla IUD Menopausal hormone therapy:  na Last mammogram:  na Last pap smear:   09/11/17 Negative         OB History    Gravida  0   Para  0   Term  0   Preterm  0   AB  0   Living  0     SAB  0   TAB  0   Ectopic  0   Multiple  0   Live Births                 Patient Active Problem List   Diagnosis Date Noted  . History of abnormal Pap smear 07/26/2012  . Depression     Past Medical History:  Diagnosis Date  . 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 Depression   . 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 OSA (obstructive sleep apnea)   . STD (sexually transmitted disease) 09/08/2015   Chlamydia     Past Surgical History:  Procedure Laterality Date  . COLPOSCOPY  7/12   CIN I  . skyla     inserted 10-30-14  . WISDOM TOOTH EXTRACTION      Current Outpatient Medications  Medication Sig Dispense Refill  . Levonorgestrel (SKYLA) 13.5 MG IUD by Intrauterine route.     No current facility-administered medications for this visit.      ALLERGIES: Patient has no known allergies.  Family History  Problem Relation Age of Onset  . Cancer Maternal Aunt        Lung    Social History   Socioeconomic History  . Marital status: Single    Spouse name: Not on file  . Number of children: 0  . Years of education: College  . Highest education level: Not on file  Occupational History  . Occupation: [employer]  . Financial resource strain: Not on file  . Food insecurity:    Worry: Not on file    Inability: Not on file  . Transportation needs:    Medical: Not on file    Non-medical: Not on file  Tobacco Use   . Smoking status: Light Tobacco Smoker    Packs/day: 0.25    Types: Cigarettes  . Smokeless tobacco: Never Used  . Tobacco comment: 3-4 a day  Substance and Sexual Activity  . Alcohol use: Yes    Comment: weekly  . Drug use: Not on file    Comment: occ  . Sexual activity: Yes    Partners: Male    Birth control/protection: IUD    Comment: Skyla inserted 10-30-14  Lifestyle  . Physical activity:    Days per week: Not on file    Minutes per session: Not on file  . Stress: Not on file  Relationships  . Social connections:    Talks on phone: Not on file    Gets together: Not on file    Attends religious service: Not on file    Active member of club or organization: Not on file    Attends meetings of clubs or organizations: Not on file  Relationship status: Not on file  . Intimate partner violence:    Fear of current or ex partner: Not on file    Emotionally abused: Not on file    Physically abused: Not on file    Forced sexual activity: Not on file  Other Topics Concern  . Not on file  Social History Narrative   3-4 energy drinks a week     Review of Systems  PHYSICAL EXAMINATION:    LMP  (LMP Unknown)     General appearance: alert, cooperative and appears stated age Head: Normocephalic, without obvious abnormality, atraumatic Neck: no adenopathy, supple, symmetrical, trachea midline and thyroid normal to inspection and palpation Lungs: clear to auscultation bilaterally Breasts: normal appearance, no masses or tenderness, No nipple retraction or dimpling, No nipple discharge or bleeding, No axillary or supraclavicular adenopathy Heart: regular rate and rhythm Abdomen: soft, non-tender, no masses,  no organomegaly Extremities: extremities normal, atraumatic, no cyanosis or edema Skin: Skin color, texture, turgor normal. No rashes or lesions Lymph nodes: Cervical, supraclavicular, and axillary nodes normal. No abnormal inguinal nodes palpated Neurologic: Grossly  normal  Pelvic: External genitalia:  no lesions              Urethra:  normal appearing urethra with no masses, tenderness or lesions              Bartholins and Skenes: normal                 Vagina: normal appearing vagina with normal color and discharge, no lesions              Cervix: no lesions                Bimanual Exam:  Uterus:  normal size, contour, position, consistency, mobility, non-tender              Adnexa: no mass, fullness, tenderness              Rectal exam: {yes no:314532}.  Confirms.              Anus:  normal sphincter tone, no lesions  Chaperone was present for exam.  ASSESSMENT     PLAN     An After Visit Summary was printed and given to the patient.  ______ minutes face to face time of which over 50% was spent in counseling.

## 2017-11-19 NOTE — Telephone Encounter (Signed)
Called and left patient a message to call back to reschedule her missed appointment for today for a 4 week IUD recheck with Dr. Edward JollySilva.

## 2017-11-21 NOTE — Telephone Encounter (Signed)
Thank you for the update!

## 2017-11-22 ENCOUNTER — Other Ambulatory Visit: Payer: Self-pay

## 2017-11-22 ENCOUNTER — Ambulatory Visit: Payer: BLUE CROSS/BLUE SHIELD | Admitting: Obstetrics and Gynecology

## 2017-11-22 VITALS — BP 110/66 | HR 72 | Resp 14 | Ht 70.5 in | Wt 174.0 lb

## 2017-11-22 DIAGNOSIS — Z30431 Encounter for routine checking of intrauterine contraceptive device: Secondary | ICD-10-CM | POA: Diagnosis not present

## 2017-11-22 DIAGNOSIS — N750 Cyst of Bartholin's gland: Secondary | ICD-10-CM

## 2017-11-22 NOTE — Progress Notes (Signed)
GYNECOLOGY  VISIT   HPI: 27 y.o.   Single  Caucasian  female   G0P0000 with No LMP recorded. (Menstrual status: IUD).   here for   4 week recheck of IUD.  Had exchange of Skyla for [place] IUD.  "I love it!"  No vaginal bleeding.  No significant cramping.   No pain with intercourse.   Sill firmness of the left vulva.   GYNECOLOGIC HISTORY: No LMP recorded. (Menstrual status: IUD). Contraception: IUD  Menopausal hormone therapy: None   Last mammogram:  Na  Last pap smear:   09/11/17 Neg         OB History    Gravida  0   Para  0   Term  0   Preterm  0   AB  0   Living  0     SAB  0   TAB  0   Ectopic  0   Multiple  0   Live Births                 Patient Active Problem List   Diagnosis Date Noted  . History of abnormal Pap smear 07/26/2012  . Depression     Past Medical History:  Diagnosis Date  . 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 Depression   . 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 OSA (obstructive sleep apnea)   . STD (sexually transmitted disease) 09/08/2015   Chlamydia     Past Surgical History:  Procedure Laterality Date  . COLPOSCOPY  7/12   CIN I  . skyla     inserted 10-30-14  . WISDOM TOOTH EXTRACTION      Current Outpatient Medications  Medication Sig Dispense Refill  . Levonorgestrel (SKYLA) 13.5 MG IUD by Intrauterine route.     No current facility-administered medications for this visit.      ALLERGIES: Patient has no known allergies.  Family History  Problem Relation Age of Onset  . Cancer Maternal Aunt        Lung    Social History   Socioeconomic History  . Marital status: Single    Spouse name: Not on file  . Number of children: 0  . Years of education: College  . Highest education level: Not on file  Occupational History  . Occupation: [employer]  . Financial resource strain: Not on file  . Food insecurity:    Worry: Not  on file    Inability: Not on file  . Transportation needs:    Medical: Not on file    Non-medical: Not on file  Tobacco Use  . Smoking status: Light Tobacco Smoker    Packs/day: 0.25    Types: Cigarettes  . Smokeless tobacco: Never Used  . Tobacco comment: 3-4 a day  Substance and Sexual Activity  . Alcohol use: Yes    Comment: weekly  . Drug use: Not on file    Comment: occ  . Sexual activity: Yes    Partners: Male    Birth control/protection: IUD    Comment: Skyla inserted 10-30-14  Lifestyle  . Physical activity:    Days per week: Not on file    Minutes per session: Not on file  . Stress: Not on file  Relationships  . Social connections:    Talks on phone: Not on file    Gets together: Not on file    Attends  religious service: Not on file    Active member of club or organization: Not on file    Attends meetings of clubs or organizations: Not on file    Relationship status: Not on file  . Intimate partner violence:    Fear of current or ex partner: Not on file    Emotionally abused: Not on file    Physically abused: Not on file    Forced sexual activity: Not on file  Other Topics Concern  . Not on file  Social History Narrative   3-4 energy drinks a week     Review of Systems  Genitourinary:       Vaginal lump. Patient states that there is still firmness from the cyst she previously had.     PHYSICAL EXAMINATION:    BP 110/66   Pulse 72   Resp 14   Ht 5' 10.5" (1.791 m)   Wt 174 lb (78.9 kg)   BMI 24.61 kg/m     General appearance: alert, cooperative and appears stated age   Pelvic: External genitalia:  Left Bartholin's gland cyst - 2 cm, nontender.               Urethra:  normal appearing urethra with no masses, tenderness or lesions              Bartholins and Skenes: normal                 Vagina: normal appearing vagina with normal color and discharge, no lesions              Cervix: no lesions.  IUD strings noted.  2 cm length.                  Bimanual Exam:  Uterus:  normal size, contour, position, consistency, mobility, non-tender              Adnexa: no mass, fullness, tenderness           Chaperone was present for exam.  ASSESSMENT  Kyleena IUD check up.  Doing well. Left Bartholin's gland cyst.   PLAN  IUD check yearly at annual exams.  We discussed her Bartholin gland cyst and possible tx options for care - observation, marsupialization, and excision.  If she chooses a treatment option, I recommend marsupialization in the office setting.  FU for annual exam and pap with Sara Chu.   An After Visit Summary was printed and given to the patient.  ______ minutes face to face time of which over 50% was spent in counseling.

## 2017-11-22 NOTE — Patient Instructions (Signed)
Bartholin Cyst or Abscess A Bartholin cyst is a fluid-filled sac that forms on a Bartholin gland. Bartholin glands are small glands that are located within the folds of skin (labia) along the sides of the lower opening of the vagina. These glands produce a fluid to moisten the outside of the vagina during sexual intercourse. A Bartholin cyst causes a bulge on the side of the vagina. A cyst that is not large or infected may not cause symptoms or problems. However, if the fluid within the cyst becomes infected, the cyst can turn into an abscess. An abscess may cause discomfort or pain. What are the causes? A Bartholin cyst may develop when the duct of the gland becomes blocked. In many cases, the cause of this is not known. Various kinds of bacteria can cause the cyst to become infected and develop into an abscess. What increases the risk? You may be at an increased risk of developing a Bartholin cyst or abscess if:  You are a woman of reproductive age.  You have a history of previous Bartholin cysts or abscesses.  You have diabetes.  You have a sexually transmitted disease (STD).  What are the signs or symptoms? The severity of symptoms varies depending on the size of the cyst and whether it is infected. Symptoms may include:  A bulge or swelling near the lower opening of your vagina.  Discomfort or pain.  Redness.  Pain during sexual intercourse.  Pain when walking.  Fluid draining from the area.  How is this diagnosed? Your health care provider may make a diagnosis based on your symptoms and a physical exam. He or she will look for swelling in your vaginal area. Blood tests may be done to check for infections. A sample of fluid from the cyst or abscess may also be taken to be tested in a lab. How is this treated? Small cysts that are not infected may not require any treatment. These often go away on their own. Yourhealth care provider will recommend hot baths and the use of warm  compresses. These may also be part of the treatment for an abscess. Treatment options for a large cyst or abscess may include:  Antibiotic medicine.  A surgical procedure to drain the abscess. One of the following procedures may be done: ? Incision and drainage. An incision is made in the cyst or abscess so that the fluid drains out. A catheter may be placed inside the cyst so that it does not close and fill up with fluid again. The catheter will be removed after you have a follow-up visit with a specialist (gynecologist). ? Marsupialization. The cyst or abscess is opened and kept open by stitching the edges of the skin to the walls of the cyst or abscess. This allows it to continue to drain and not fill up with fluid again.  If you have cysts or abscesses that keep returning and have required incision and drainage multiple times, your health care provider may talk to you about surgery to remove the Bartholin gland. Follow these instructions at home:  Take medicines only as directed by your health care provider.  If you were prescribed an antibiotic medicine, finish it all even if you start to feel better.  Apply warm, wet compresses to the area or take warm, shallow baths that cover your pelvic region (sitz baths) several times a day or as directed by your health care provider.  Do not squeeze the cyst or apply heavy pressure to it.    Do not have sexual intercourse until the cyst has gone away.  If your cyst or abscess was opened, a small piece of gauze or a drain may have been placed in the area to allow drainage. Do not remove the gauze or the drain until directed by your health care provider.  Wear feminine pads-not tampons-as needed for any drainage or bleeding.  Keep all follow-up visits as directed by your health care provider. This is important. How is this prevented? Take these steps to help prevent a Bartholin cyst from returning:  Practice good hygiene.  Clean your vaginal  area with mild soap and a soft cloth when you bathe.  Practice safe sex to prevent STDs.  Contact a health care provider if:  You have increased pain, swelling, or redness in the area of the cyst.  Puslike drainage is coming from the cyst.  You have a fever. This information is not intended to replace advice given to you by your health care provider. Make sure you discuss any questions you have with your health care provider. Document Released: 02/13/2005 Document Revised: 07/22/2015 Document Reviewed: 09/29/2013 Elsevier Interactive Patient Education  2018 Elsevier Inc.  

## 2017-11-25 ENCOUNTER — Encounter: Payer: Self-pay | Admitting: Obstetrics and Gynecology

## 2018-08-22 ENCOUNTER — Telehealth: Payer: Self-pay | Admitting: Certified Nurse Midwife

## 2018-08-22 DIAGNOSIS — R109 Unspecified abdominal pain: Secondary | ICD-10-CM | POA: Diagnosis not present

## 2018-08-22 NOTE — Telephone Encounter (Signed)
Encounter has been reviewed and is closed.  I do recommend PCP evaluation.

## 2018-08-22 NOTE — Telephone Encounter (Signed)
Spoke with Dr.Silva and decided since patient's pain is all epigastric pain, will have patient call her PCP for appointment. Patient thanked [place] for calling her back. Routed to Dr.Silva to sign and close encounter

## 2018-08-22 NOTE — Telephone Encounter (Signed)
Spoke with patient. Complaining of mid/epigastric pain x2-3 days. Pain began 4-5 days ago as lower menstrual cramping, she took Ibuprofen.BC Kyleena--no cycles. Then developed into what she felt was abdominal gas and took Gas-x and antacids and now over the last 2-3 days pain has settled in mid abdomen/epigastric area with some nausea. It is waking her up at night. Denies any fever, vomiting or change in bowel habits. Pain is all epigastric at this point. Will discuss with provider if needs visit here or with PCP and call her back.

## 2018-08-22 NOTE — Telephone Encounter (Signed)
Patient has been having abdominal pain for a week.

## 2018-09-05 DIAGNOSIS — R1030 Lower abdominal pain, unspecified: Secondary | ICD-10-CM | POA: Diagnosis not present

## 2018-09-06 ENCOUNTER — Telehealth: Payer: Self-pay | Admitting: Certified Nurse Midwife

## 2018-09-06 ENCOUNTER — Other Ambulatory Visit: Payer: Self-pay

## 2018-09-06 ENCOUNTER — Other Ambulatory Visit (HOSPITAL_COMMUNITY)
Admission: RE | Admit: 2018-09-06 | Discharge: 2018-09-06 | Disposition: A | Discharge status: Home / Self Care | Payer: BC Managed Care – PPO | Source: Ambulatory Visit | Attending: Obstetrics & Gynecology | Admitting: Obstetrics & Gynecology

## 2018-09-06 ENCOUNTER — Encounter: Payer: Self-pay | Admitting: Obstetrics & Gynecology

## 2018-09-06 ENCOUNTER — Ambulatory Visit: Payer: BC Managed Care – PPO | Admitting: Obstetrics & Gynecology

## 2018-09-06 VITALS — BP 106/60 | HR 66 | Temp 97.3°F | Resp 14 | Ht 70.5 in | Wt 182.0 lb

## 2018-09-06 DIAGNOSIS — R1032 Left lower quadrant pain: Secondary | ICD-10-CM

## 2018-09-06 LAB — POCT URINALYSIS DIPSTICK
Bilirubin, UA: NEGATIVE
Glucose, UA: NEGATIVE
Ketones, UA: NEGATIVE
Leukocytes, UA: NEGATIVE
Nitrite, UA: NEGATIVE
Protein, UA: NEGATIVE
Urobilinogen, UA: NEGATIVE E.U./dL — AB

## 2018-09-06 NOTE — Addendum Note (Signed)
Addended by: Yates Decamp on: 09/06/2018 12:49 PM   Modules accepted: Orders

## 2018-09-06 NOTE — Progress Notes (Signed)
GYNECOLOGY  VISIT   CC:   LLQ  HPI: 28 y.o. G0P0000 Single White or Caucasian female here for left lower quadrant pain that started last night that was sharp.  She did have come cold/chill sensation with her pain but did check her temperature and this was normal.    She had a Kyleena IUD placed last year.  A Skyla IUD was used previously.  She does not typically bleed with her IUD.  She does have worse cramping and more emotional around her cycle which is worse than when she had the [employer] [facility] I[facility].  Denies urinary symptoms.  Denies urgency, dysuria, hesitancy.  About 2 1/2 weeks ago, she had some upper abdominal pain.  Saw PCP then and did some   Is SA with same partner for about a 1 1/2 years.  Had GC/Chl 08/29/17.  Denies current vaginal discharge.    [place]    GYNECOLOGIC HISTORY: No LMP recorded. (Menstrual status: IUD). Contraception: kyleena IUD  Menopausal hormone therapy: none  Patient Active Problem List   Diagnosis Date Noted  . History of abnormal Pap smear 07/26/2012  . Depression     Past Medical History:  Diagnosis Date  . 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 Depression   . 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 OSA (obstructive sleep apnea)   . STD (sexually transmitted disease) 09/08/2015   Chlamydia     Past Surgical History:  Procedure Laterality Date  . COLPOSCOPY  7/12   CIN I  . skyla     inserted 10-30-14  . WISDOM TOOTH EXTRACTION      MEDS:   Current Outpatient Medications on File Prior to Visit  Medication Sig Dispense Refill  . Levonorgestrel (KYLEENA) 19.5 MG IUD by Intrauterine route.    . Probiotic Product (PROBIOTIC PO) Take by mouth daily.     No current facility-administered medications on file prior to visit.     ALLERGIES: Patient has no known allergies.  Family History  Problem Relation Age of Onset  . Cancer Maternal Aunt        Lung     SH:  Single, non smoker  Review of Systems  Genitourinary: Positive for pelvic pain.       Discomfort after intercourse Brown vaginal discharge  All other systems reviewed and are negative.   PHYSICAL EXAMINATION:    BP 106/60 (BP Location: Left Arm, Patient Position: Sitting, Cuff Size: Normal)   Pulse 66   Temp (!) 97.3 F (36.3 C) (Temporal)   Resp 14   Ht 5' 10.5" (1.791 m)   Wt 182 lb (82.6 kg)   BMI 25.75 kg/m     General appearance: alert, cooperative and appears stated age CV:  Regular rate and rhythm Lungs:  clear to auscultation, no wheezes, rales or rhonchi, symmetric air entry Flank:  No CVA tenderness Abdomen: soft, non-tender; bowel sounds normal; no masses,  no organomegaly Lymph:  no inguinal LAD noted  Pelvic: External genitalia:  no lesions              Urethra:  normal appearing urethra with no masses, tenderness or lesions              Bartholins and Skenes: normal                 Vagina: normal appearing vagina with normal color and discharge, no lesions  Cervix: no lesions and 2cm IUD string noted              Bimanual Exam:  Uterus:  normal size, contour, position, consistency, mobility, non-tender              Adnexa: no masses, fullness, tenderness in RLQ, adnexal tenderness noted in LLQ, no masses or fullness noted however              Anus:  no lesions  Chaperone was present for exam.  Assessment: LLQ pain, improved today Kyleena IUD present  Plan: Urine culture pending GC/CHl pending Pt will return for PUS.  Order placed. Motrin/tylenol recommended for pain/discomfort over the next few days.

## 2018-09-06 NOTE — Telephone Encounter (Signed)
Spoke with patient. She has Kyleena IUD inserted 10-22-17 and states developed severe LLQ pain last PM and still this AM. Denies any fever or urinary symptoms. Patient states 2 days ago after intercourse, she did have pelvic pain so intense it may her vomit. Made appointment for evaluation today at 12:45pm with Dr.Miller. Advised patient this was a work in appointment. She voiced understanding.

## 2018-09-06 NOTE — Telephone Encounter (Signed)
Patient is having severe LLQ abdominal pain and cramping.

## 2018-09-08 LAB — URINE CULTURE

## 2018-09-10 ENCOUNTER — Encounter: Payer: Self-pay | Admitting: Obstetrics & Gynecology

## 2018-09-10 ENCOUNTER — Other Ambulatory Visit: Payer: Self-pay

## 2018-09-10 ENCOUNTER — Ambulatory Visit: Payer: BC Managed Care – PPO | Admitting: Obstetrics & Gynecology

## 2018-09-10 ENCOUNTER — Ambulatory Visit (INDEPENDENT_AMBULATORY_CARE_PROVIDER_SITE_OTHER): Payer: BC Managed Care – PPO

## 2018-09-10 VITALS — BP 96/52 | HR 72 | Temp 97.7°F | Ht 70.5 in | Wt 181.6 lb

## 2018-09-10 DIAGNOSIS — R1032 Left lower quadrant pain: Secondary | ICD-10-CM

## 2018-09-10 DIAGNOSIS — N83201 Unspecified ovarian cyst, right side: Secondary | ICD-10-CM | POA: Diagnosis not present

## 2018-09-10 LAB — GC/CHLAMYDIA PROBE AMP (~~LOC~~) NOT AT ARMC
Chlamydia: NEGATIVE
Neisseria Gonorrhea: NEGATIVE

## 2018-09-10 NOTE — Progress Notes (Signed)
28 y.o. G0P0000 Single White or Caucasian female here for pelvic ultrasound due to LLQ pain that occurred in the evening of 09/05/2018.  She was seen 09/06/2018 and pain was improved.  GC/Chl was obtained and that was negative.  She reports her pain is improved.  She has experienced a little constipation and does have a little cramping as well.  No LMP recorded (lmp unknown). (Menstrual status: IUD).  Contraception: IUD  Findings:  UTERUS: 8.8 x 4.9 x 4.0cm with IUD in correct location EMS: 2.19mm ADNEXA: Left ovary: 2.4 x 2.5 x 1.2cm       Right ovary:  3.5 x 2.4 x 2.6cm with resolvin gcorpus luteal cyst CUL DE SAC: small amount of free fluid  Discussion:  Findings reviewed with pt.  Pictures reviewed and copies given.  Corpus luteal cyst on right discussed.  Typical resolution discussed.  As she has improved, feel conservative management is appropriate.  Assessment:  LLQ pain, improved Right resolving corpus luteal cyst  Plan:  Return for PUS and call with any new issus/concerns.  ~15 minutes spent with patient >50% of time was in face to face discussion of above.

## 2018-09-10 NOTE — Patient Instructions (Signed)
Ovarian Cyst An ovarian cyst is a fluid-filled sac on an ovary. The ovaries are organs that make eggs in women. Most ovarian cysts go away on their own and are not cancerous (are benign). Some cysts need treatment. Follow these instructions at home:  Take over-the-counter and prescription medicines only as told by your doctor.  Do not drive or use heavy machinery while taking prescription pain medicine.  Get pelvic exams and Pap tests as often as told by your doctor.  Return to your normal activities as told by your doctor. Ask your doctor what activities are safe for you.  Do not use any products that contain nicotine or tobacco, such as cigarettes and e-cigarettes. If you need help quitting, ask your doctor.  Keep all follow-up visits as told by your doctor. This is important. Contact a doctor if:  Your periods are: ? Late. ? Irregular. ? Painful.   Your periods stop.  You have pelvic pain that does not go away.  You have pressure on your bladder.  You have trouble making your bladder empty when you pee (urinate).  You have pain during sex.  You have any of the following in your belly (abdomen): ? A feeling of fullness. ? Pressure. ? Discomfort. ? Pain that does not go away. ? Swelling.  You feel sick most of the time.  You have trouble pooping (have constipation).  You are not as hungry as usual (you lose your appetite).  You get very bad acne.  You start to have more hair on your body and face.  You are gaining weight or losing weight without changing your exercise and eating habits.  You think you may be pregnant. Get help right away if:  You have belly pain that is very bad or gets worse.  You cannot eat or drink without throwing up (vomiting).  You suddenly get a fever.  Your period is a lot heavier than usual. This information is not intended to replace advice given to you by your health care provider. Make sure you discuss any questions you have  with your health care provider. Document Released: 08/02/2007 Document Revised: 01/26/2017 Document Reviewed: 07/18/2015 Elsevier Patient Education  2020 [employer].  

## 2018-09-13 ENCOUNTER — Ambulatory Visit: Payer: BLUE CROSS/BLUE SHIELD | Admitting: Certified Nurse Midwife

## 2018-10-20 ENCOUNTER — Emergency Department (HOSPITAL_COMMUNITY): Payer: BC Managed Care – PPO | Admitting: Anesthesiology

## 2018-10-20 ENCOUNTER — Emergency Department (HOSPITAL_COMMUNITY): Payer: BC Managed Care – PPO

## 2018-10-20 ENCOUNTER — Ambulatory Visit (HOSPITAL_COMMUNITY)
Admission: EM | Admit: 2018-10-20 | Discharge: 2018-10-20 | Disposition: A | Discharge status: Home / Self Care | Payer: BC Managed Care – PPO | Attending: Emergency Medicine | Admitting: Emergency Medicine

## 2018-10-20 ENCOUNTER — Encounter: Payer: Self-pay | Admitting: Obstetrics & Gynecology

## 2018-10-20 ENCOUNTER — Other Ambulatory Visit: Payer: Self-pay

## 2018-10-20 ENCOUNTER — Encounter (HOSPITAL_COMMUNITY)
Admission: EM | Disposition: A | Discharge status: Home / Self Care | Payer: Self-pay | Source: Home / Self Care | Attending: Emergency Medicine

## 2018-10-20 DIAGNOSIS — O081 Delayed or excessive hemorrhage following ectopic and molar pregnancy: Secondary | ICD-10-CM | POA: Diagnosis not present

## 2018-10-20 DIAGNOSIS — K661 Hemoperitoneum: Secondary | ICD-10-CM | POA: Diagnosis not present

## 2018-10-20 DIAGNOSIS — G4733 Obstructive sleep apnea (adult) (pediatric): Secondary | ICD-10-CM | POA: Insufficient documentation

## 2018-10-20 DIAGNOSIS — Z793 Long term (current) use of hormonal contraceptives: Secondary | ICD-10-CM | POA: Diagnosis not present

## 2018-10-20 DIAGNOSIS — R102 Pelvic and perineal pain: Secondary | ICD-10-CM | POA: Diagnosis not present

## 2018-10-20 DIAGNOSIS — O00102 Left tubal pregnancy without intrauterine pregnancy: Secondary | ICD-10-CM | POA: Diagnosis not present

## 2018-10-20 DIAGNOSIS — O009 Unspecified ectopic pregnancy without intrauterine pregnancy: Secondary | ICD-10-CM

## 2018-10-20 DIAGNOSIS — F329 Major depressive disorder, single episode, unspecified: Secondary | ICD-10-CM | POA: Diagnosis not present

## 2018-10-20 DIAGNOSIS — Z20828 Contact with and (suspected) exposure to other viral communicable diseases: Secondary | ICD-10-CM | POA: Diagnosis not present

## 2018-10-20 DIAGNOSIS — Z30432 Encounter for removal of intrauterine contraceptive device: Secondary | ICD-10-CM | POA: Diagnosis not present

## 2018-10-20 DIAGNOSIS — Z3A01 Less than 8 weeks gestation of pregnancy: Secondary | ICD-10-CM | POA: Diagnosis not present

## 2018-10-20 DIAGNOSIS — O26899 Other specified pregnancy related conditions, unspecified trimester: Secondary | ICD-10-CM | POA: Diagnosis not present

## 2018-10-20 DIAGNOSIS — Z03818 Encounter for observation for suspected exposure to other biological agents ruled out: Secondary | ICD-10-CM | POA: Diagnosis not present

## 2018-10-20 HISTORY — PX: DIAGNOSTIC LAPAROSCOPY WITH REMOVAL OF ECTOPIC PREGNANCY: SHX6449

## 2018-10-20 LAB — CBC WITH DIFFERENTIAL/PLATELET
Abs Immature Granulocytes: 0.02 10*3/uL (ref 0.00–0.07)
Basophils Absolute: 0 10*3/uL (ref 0.0–0.1)
Basophils Relative: 1 %
Eosinophils Absolute: 0 10*3/uL (ref 0.0–0.5)
Eosinophils Relative: 1 %
HCT: 39.8 % (ref 36.0–46.0)
Hemoglobin: 13.2 g/dL (ref 12.0–15.0)
Immature Granulocytes: 0 %
Lymphocytes Relative: 13 %
Lymphs Abs: 0.8 10*3/uL (ref 0.7–4.0)
MCH: 33.6 pg (ref 26.0–34.0)
MCHC: 33.2 g/dL (ref 30.0–36.0)
MCV: 101.3 fL — ABNORMAL HIGH (ref 80.0–100.0)
Monocytes Absolute: 0.5 10*3/uL (ref 0.1–1.0)
Monocytes Relative: 8 %
Neutro Abs: 4.6 10*3/uL (ref 1.7–7.7)
Neutrophils Relative %: 77 %
Platelets: 191 10*3/uL (ref 150–400)
RBC: 3.93 MIL/uL (ref 3.87–5.11)
RDW: 12.2 % (ref 11.5–15.5)
WBC: 6 10*3/uL (ref 4.0–10.5)
nRBC: 0 % (ref 0.0–0.2)

## 2018-10-20 LAB — COMPREHENSIVE METABOLIC PANEL
ALT: 18 U/L (ref 0–44)
AST: 28 U/L (ref 15–41)
Albumin: 4.5 g/dL (ref 3.5–5.0)
Alkaline Phosphatase: 42 U/L (ref 38–126)
Anion gap: 13 (ref 5–15)
BUN: 11 mg/dL (ref 6–20)
CO2: 19 mmol/L — ABNORMAL LOW (ref 22–32)
Calcium: 9.3 mg/dL (ref 8.9–10.3)
Chloride: 105 mmol/L (ref 98–111)
Creatinine, Ser: 0.75 mg/dL (ref 0.44–1.00)
GFR calc Af Amer: >60 mL/min (ref >60)
GFR calc non Af Amer: >60 mL/min (ref >60)
Glucose, Bld: 110 mg/dL — ABNORMAL HIGH (ref 70–99)
Potassium: 3.6 mmol/L (ref 3.5–5.1)
Sodium: 137 mmol/L (ref 135–145)
Total Bilirubin: 1.3 mg/dL — ABNORMAL HIGH (ref 0.3–1.2)
Total Protein: 7.1 g/dL (ref 6.5–8.1)

## 2018-10-20 LAB — URINALYSIS, ROUTINE W REFLEX MICROSCOPIC
Bacteria, UA: NONE SEEN
Bilirubin Urine: NEGATIVE
Glucose, UA: NEGATIVE mg/dL
Ketones, ur: 5 mg/dL — AB
Leukocytes,Ua: NEGATIVE
Nitrite: NEGATIVE
Protein, ur: NEGATIVE mg/dL
Specific Gravity, Urine: 1.012 (ref 1.005–1.030)
pH: 7 (ref 5.0–8.0)

## 2018-10-20 LAB — SARS CORONAVIRUS 2 (TAT 6-24 HRS): SARS Coronavirus 2: NEGATIVE

## 2018-10-20 LAB — TYPE AND SCREEN
ABO/RH(D): O POS
Antibody Screen: NEGATIVE

## 2018-10-20 LAB — I-STAT BETA HCG BLOOD, ED (MC, WL, AP ONLY): I-stat hCG, quantitative: 1467.8 m[IU]/mL — ABNORMAL HIGH (ref <5)

## 2018-10-20 LAB — ABO/RH: ABO/RH(D): O POS

## 2018-10-20 SURGERY — LAPAROSCOPY, WITH ECTOPIC PREGNANCY SURGICAL TREATMENT
Anesthesia: General | Site: Abdomen

## 2018-10-20 MED ORDER — MORPHINE SULFATE (PF) 4 MG/ML IV SOLN
4.0000 mg | Freq: Once | INTRAVENOUS | Status: AC
  Administered 2018-10-20: 4 mg via INTRAVENOUS
  Filled 2018-10-20: qty 1

## 2018-10-20 MED ORDER — PHENYLEPHRINE 40 MCG/ML (10ML) SYRINGE FOR IV PUSH (FOR BLOOD PRESSURE SUPPORT)
PREFILLED_SYRINGE | INTRAVENOUS | Status: AC
  Filled 2018-10-20: qty 10

## 2018-10-20 MED ORDER — MIDAZOLAM HCL 2 MG/2ML IJ SOLN
INTRAMUSCULAR | Status: AC
  Filled 2018-10-20: qty 2

## 2018-10-20 MED ORDER — ROCURONIUM BROMIDE 10 MG/ML (PF) SYRINGE
PREFILLED_SYRINGE | INTRAVENOUS | Status: AC
  Filled 2018-10-20: qty 10

## 2018-10-20 MED ORDER — FENTANYL CITRATE (PF) 100 MCG/2ML IJ SOLN
INTRAMUSCULAR | Status: DC | PRN
  Administered 2018-10-20: 50 ug via INTRAVENOUS
  Administered 2018-10-20: 100 ug via INTRAVENOUS

## 2018-10-20 MED ORDER — SUCCINYLCHOLINE CHLORIDE 20 MG/ML IJ SOLN
INTRAMUSCULAR | Status: DC | PRN
  Administered 2018-10-20: 100 mg via INTRAVENOUS

## 2018-10-20 MED ORDER — SCOPOLAMINE 1 MG/3DAYS TD PT72: 1.0000 | MEDICATED_PATCH | Freq: Once | TRANSDERMAL | Status: DC

## 2018-10-20 MED ORDER — BUPIVACAINE HCL (PF) 0.25 % IJ SOLN
INTRAMUSCULAR | Status: DC | PRN
  Administered 2018-10-20: 8 mL

## 2018-10-20 MED ORDER — ONDANSETRON HCL 4 MG/2ML IJ SOLN
4.0000 mg | Freq: Once | INTRAMUSCULAR | Status: AC
  Administered 2018-10-20: 4 mg via INTRAVENOUS
  Filled 2018-10-20: qty 2

## 2018-10-20 MED ORDER — PHENYLEPHRINE HCL (PRESSORS) 10 MG/ML IV SOLN
INTRAVENOUS | Status: DC | PRN
  Administered 2018-10-20: 100 ug via INTRAVENOUS

## 2018-10-20 MED ORDER — SCOPOLAMINE 1 MG/3DAYS TD PT72
MEDICATED_PATCH | TRANSDERMAL | Status: AC
  Filled 2018-10-20: qty 1

## 2018-10-20 MED ORDER — SOD CITRATE-CITRIC ACID 500-334 MG/5ML PO SOLN
ORAL | Status: AC
  Filled 2018-10-20: qty 15

## 2018-10-20 MED ORDER — SUCCINYLCHOLINE CHLORIDE 200 MG/10ML IV SOSY
PREFILLED_SYRINGE | INTRAVENOUS | Status: AC
  Filled 2018-10-20: qty 10

## 2018-10-20 MED ORDER — MIDAZOLAM HCL 2 MG/2ML IJ SOLN: 0.5000 mg | Freq: Once | INTRAMUSCULAR | Status: DC | PRN

## 2018-10-20 MED ORDER — ROCURONIUM BROMIDE 50 MG/5ML IV SOSY
PREFILLED_SYRINGE | INTRAVENOUS | Status: DC | PRN
  Administered 2018-10-20: 10 mg via INTRAVENOUS

## 2018-10-20 MED ORDER — MEPERIDINE HCL 25 MG/ML IJ SOLN: 6.2500 mg | INTRAMUSCULAR | Status: DC | PRN

## 2018-10-20 MED ORDER — HYDROCODONE-ACETAMINOPHEN 5-325 MG PO TABS: 1.0000 | ORAL_TABLET | Freq: Four times a day (QID) | ORAL | 0 refills | Status: DC | PRN

## 2018-10-20 MED ORDER — HYDROMORPHONE HCL 1 MG/ML IJ SOLN
0.2500 mg | INTRAMUSCULAR | Status: DC | PRN
  Administered 2018-10-20: 0.25 mg via INTRAVENOUS

## 2018-10-20 MED ORDER — BUPIVACAINE HCL (PF) 0.25 % IJ SOLN
INTRAMUSCULAR | Status: AC
  Filled 2018-10-20: qty 30

## 2018-10-20 MED ORDER — DEXAMETHASONE SODIUM PHOSPHATE 10 MG/ML IJ SOLN
INTRAMUSCULAR | Status: DC | PRN
  Administered 2018-10-20: 10 mg via INTRAVENOUS

## 2018-10-20 MED ORDER — PROMETHAZINE HCL 25 MG/ML IJ SOLN: 6.2500 mg | INTRAMUSCULAR | Status: DC | PRN

## 2018-10-20 MED ORDER — LIDOCAINE HCL (CARDIAC) PF 100 MG/5ML IV SOSY
PREFILLED_SYRINGE | INTRAVENOUS | Status: DC | PRN
  Administered 2018-10-20: 30 mg via INTRAVENOUS

## 2018-10-20 MED ORDER — LIDOCAINE 2% (20 MG/ML) 5 ML SYRINGE
INTRAMUSCULAR | Status: AC
  Filled 2018-10-20: qty 5

## 2018-10-20 MED ORDER — ONDANSETRON HCL 4 MG/2ML IJ SOLN
INTRAMUSCULAR | Status: DC | PRN
  Administered 2018-10-20: 4 mg via INTRAVENOUS

## 2018-10-20 MED ORDER — PROPOFOL 10 MG/ML IV BOLUS
INTRAVENOUS | Status: DC | PRN
  Administered 2018-10-20: 200 mg via INTRAVENOUS

## 2018-10-20 MED ORDER — SODIUM CHLORIDE 0.9 % IV SOLN
Freq: Once | INTRAVENOUS | Status: AC
  Administered 2018-10-20: 12:00:00 via INTRAVENOUS

## 2018-10-20 MED ORDER — HYDROMORPHONE HCL 1 MG/ML IJ SOLN
INTRAMUSCULAR | Status: AC
  Filled 2018-10-20: qty 1

## 2018-10-20 MED ORDER — SODIUM CHLORIDE 0.9 % IV SOLN
INTRAVENOUS | Status: DC | PRN
  Administered 2018-10-20: 12:00:00 via INTRAVENOUS

## 2018-10-20 MED ORDER — SODIUM CHLORIDE 0.9 % IR SOLN
Status: DC | PRN
  Administered 2018-10-20: 3000 mL

## 2018-10-20 MED ORDER — FENTANYL CITRATE (PF) 250 MCG/5ML IJ SOLN
INTRAMUSCULAR | Status: AC
  Filled 2018-10-20: qty 5

## 2018-10-20 MED ORDER — SUGAMMADEX SODIUM 200 MG/2ML IV SOLN
INTRAVENOUS | Status: DC | PRN
  Administered 2018-10-20: 200 mg via INTRAVENOUS

## 2018-10-20 MED ORDER — ONDANSETRON HCL 4 MG/2ML IJ SOLN
INTRAMUSCULAR | Status: AC
  Filled 2018-10-20: qty 2

## 2018-10-20 MED ORDER — ROCURONIUM BROMIDE 100 MG/10ML IV SOLN
INTRAVENOUS | Status: DC | PRN
  Administered 2018-10-20: 40 mg via INTRAVENOUS

## 2018-10-20 MED ORDER — MIDAZOLAM HCL 5 MG/5ML IJ SOLN
INTRAMUSCULAR | Status: DC | PRN
  Administered 2018-10-20: 2 mg via INTRAVENOUS

## 2018-10-20 MED ORDER — LACTATED RINGERS IV SOLN
INTRAVENOUS | Status: DC | PRN
  Administered 2018-10-20: 14:00:00 via INTRAVENOUS

## 2018-10-20 MED ORDER — SODIUM CHLORIDE 0.9 % IV SOLN
INTRAVENOUS | Status: AC
  Filled 2018-10-20: qty 2

## 2018-10-20 MED ORDER — PROPOFOL 10 MG/ML IV BOLUS
INTRAVENOUS | Status: AC
  Filled 2018-10-20: qty 20

## 2018-10-20 MED ORDER — IBUPROFEN 800 MG PO TABS: 800.0000 mg | ORAL_TABLET | Freq: Three times a day (TID) | ORAL | 0 refills | Status: DC | PRN

## 2018-10-20 MED ORDER — DEXAMETHASONE SODIUM PHOSPHATE 10 MG/ML IJ SOLN
INTRAMUSCULAR | Status: AC
  Filled 2018-10-20: qty 1

## 2018-10-20 MED ORDER — SOD CITRATE-CITRIC ACID 500-334 MG/5ML PO SOLN: 30.0000 mL | Freq: Once | ORAL | Status: DC

## 2018-10-20 MED ORDER — SODIUM CHLORIDE 0.9 % IV SOLN
INTRAVENOUS | Status: DC | PRN
  Administered 2018-10-20: 2 g via INTRAVENOUS

## 2018-10-20 MED ORDER — SODIUM CHLORIDE 0.9 % IV BOLUS
1000.0000 mL | Freq: Once | INTRAVENOUS | Status: AC
  Administered 2018-10-20: 1000 mL via INTRAVENOUS

## 2018-10-20 SURGICAL SUPPLY — 44 items
APPLICATOR ARISTA FLEXITIP XL (MISCELLANEOUS) IMPLANT
BENZOIN TINCTURE PRP APPL 2/3 (GAUZE/BANDAGES/DRESSINGS) IMPLANT
CABLE HIGH FREQUENCY MONO STRZ (ELECTRODE) ×2 IMPLANT
CATH ROBINSON RED A/P 16FR (CATHETERS) IMPLANT
DERMABOND ADVANCED (GAUZE/BANDAGES/DRESSINGS)
DERMABOND ADVANCED .7 DNX12 (GAUZE/BANDAGES/DRESSINGS) IMPLANT
DRSG OPSITE POSTOP 3X4 (GAUZE/BANDAGES/DRESSINGS) ×1 IMPLANT
DURAPREP 26ML APPLICATOR (WOUND CARE) ×2 IMPLANT
FORCEPS CUTTING 33CM 5MM (CUTTING FORCEPS) IMPLANT
GLOVE BIOGEL PI IND STRL 7.0 (GLOVE) ×4 IMPLANT
GLOVE BIOGEL PI INDICATOR 7.0 (GLOVE) ×4
GLOVE ECLIPSE 6.5 STRL STRAW (GLOVE) ×2 IMPLANT
GOWN STRL REUS W/ TWL LRG LVL3 (GOWN DISPOSABLE) ×1 IMPLANT
GOWN STRL REUS W/TWL LRG LVL3 (GOWN DISPOSABLE) ×1
HEMOSTAT ARISTA ABSORB 3G PWDR (HEMOSTASIS) IMPLANT
KIT TURNOVER KIT B (KITS) ×2 IMPLANT
LIGASURE VESSEL 5MM BLUNT TIP (ELECTROSURGICAL) IMPLANT
NDL INSUFFLATION 14GA 120MM (NEEDLE) ×1 IMPLANT
NEEDLE INSUFFLATION 14GA 120MM (NEEDLE) ×2 IMPLANT
PACK LAPAROSCOPY BASIN (CUSTOM PROCEDURE TRAY) ×2 IMPLANT
PACK TRENDGUARD 450 HYBRID PRO (MISCELLANEOUS) IMPLANT
POUCH LAPAROSCOPIC INSTRUMENT (MISCELLANEOUS) ×2 IMPLANT
POUCH SPECIMEN RETRIEVAL 10MM (ENDOMECHANICALS) ×1 IMPLANT
PROTECTOR NERVE ULNAR (MISCELLANEOUS) ×4 IMPLANT
SET IRRIG TUBING LAPAROSCOPIC (IRRIGATION / IRRIGATOR) ×1 IMPLANT
SET TUBE SMOKE EVAC HIGH FLOW (TUBING) ×2 IMPLANT
SHEARS HARMONIC ACE PLUS 36CM (ENDOMECHANICALS) ×1 IMPLANT
SLEEVE ADV FIXATION 5X100MM (TROCAR) IMPLANT
SLEEVE ENDOPATH XCEL 5M (ENDOMECHANICALS) ×1 IMPLANT
STRIP CLOSURE SKIN 1/2X4 (GAUZE/BANDAGES/DRESSINGS) IMPLANT
SUT VICRYL 0 UR6 27IN ABS (SUTURE) ×2 IMPLANT
SUT VICRYL 4-0 PS2 18IN ABS (SUTURE) ×3 IMPLANT
SYR 30ML LL (SYRINGE) IMPLANT
SYSTEM CARTER THOMASON II (TROCAR) ×1 IMPLANT
TOWEL GREEN STERILE FF (TOWEL DISPOSABLE) ×4 IMPLANT
TRAY FOLEY W/BAG SLVR 14FR (SET/KITS/TRAYS/PACK) ×2 IMPLANT
TRENDGUARD 450 HYBRID PRO PACK (MISCELLANEOUS)
TROCAR ADV FIXATION 5X100MM (TROCAR) ×2 IMPLANT
TROCAR BALLN 12MMX100 BLUNT (TROCAR) IMPLANT
TROCAR XCEL NON-BLD 11X100MML (ENDOMECHANICALS) ×1 IMPLANT
TROCAR XCEL NON-BLD 5MMX100MML (ENDOMECHANICALS) ×2 IMPLANT
TROCAR XCEL OPT SLVE 5M 100M (ENDOMECHANICALS) IMPLANT
WARMER LAPAROSCOPE (MISCELLANEOUS) ×2 IMPLANT
WATER STERILE IRR 1000ML POUR (IV SOLUTION) IMPLANT

## 2018-10-20 NOTE — Discharge Instructions (Signed)
Post-surgical Instructions, Outpatient Surgery  You may expect to feel dizzy, weak, and drowsy for as long as 24 hours after receiving the medicine that made you sleep (anesthetic). For the first 24 hours after your surgery:    Do not drive a car, ride a bicycle, participate in physical activities, or take public transportation until you are done taking narcotic pain medicines or as directed by Dr. Sabra Heck.   Do not drink alcohol or take tranquilizers.   Do not take medicine that has not been prescribed by your physicians.   Do not sign important papers or make important decisions while on narcotic pain medicines.   Have a responsible person with you.   CARE OF INCISION  There is derma bond (skin super glue) on your incisions.  Don't pick at this and just let it loosen on it's own.  It can take up to two weeks to go away.    You may shower on the first day after your surgery.  Do not sit in a tub bath for one week.  Avoid heavy lifting (more than 15 pounds/4.5 kilograms), pushing, or pulling for at least two weeks.   Avoid activities that may risk injury to your incisions.   PAIN MANAGEMENT  Motrin 800mg .  (This is the same as 4-200mg  over the counter tablets of Motrin or ibuprofen.)  You may take this every eight hours or as needed for cramping.    Vicodin 5/325mg .  For more severe pain, take one or two tablets every four to six hours as needed for pain control.  (Remember that narcotic pain medications increase your risk of constipation.  If this becomes a problem, you may take an over the counter stool softener like Colace 100mg  up to four times a day.)  DO'S AND DON'T'S  Do not take a tub bath for one week.  You may shower on the first day after your surgery  Do not do any heavy lifting for one to two weeks.  This increases the chance of bleeding.  Do move around as you feel able.  Stairs are fine.  You may begin to exercise again as you feel able.  Do not lift any weights for  two weeks.  Do not put anything in the vagina for two weeks--no tampons, intercourse, or douching.    REGULAR MEDIATIONS/VITAMINS:  You may restart all of your regular medications as prescribed.  You may restart all of your vitamins as you normally take them.  Please start taking a multivitamin daily or iron daily after your bowel movements return to normal.  You should take a vitamin or iron (as long as you tolerate it) for about a month.  PLEASE CALL OR SEEK MEDICAL CARE IF:  You have persistent nausea and vomiting.   You have trouble eating or drinking.   You have an oral temperature above 100.5.   You have constipation that is not helped by adjusting diet or increasing fluid intake. Pain medicines are a common cause of constipation.   Unusually heavy bleeding.  You should expect some vaginal bleeding possibly like a cycle but it should not be particularly heavy.  You have redness or drainage from your incision(s) or there is increasing pain or tenderness near or in the surgical site.

## 2018-10-20 NOTE — Anesthesia Postprocedure Evaluation (Signed)
Anesthesia Post Note  Patient: Monica Bridges  Procedure(s) Performed: DIAGNOSTIC LAPAROSCOPY, LEFT SALPINGECTOMY  WITH REMOVAL OF RUPTURED ECTOPIC PREGNANCY, REMOVAL IUD (N/A Abdomen)     Patient location during evaluation: Other Anesthesia Type: General Level of consciousness: awake and alert, patient cooperative and oriented Pain management: pain level controlled Vital Signs Assessment: post-procedure vital signs reviewed and stable Respiratory status: spontaneous breathing, nonlabored ventilation and respiratory function stable Cardiovascular status: blood pressure returned to baseline and stable Postop Assessment: no apparent nausea or vomiting Anesthetic complications: no    Last Vitals:  Vitals:   10/20/18 1500 10/20/18 1520  BP: (!) 117/51 110/76  Pulse: 79 69  Resp: 16 18  Temp:    SpO2: 99% 99%    Last Pain:  Vitals:   10/20/18 1500  TempSrc:   PainSc: Asleep                 Demetrious Rainford,E. Davey Bergsma

## 2018-10-20 NOTE — Op Note (Signed)
8/23/16181w aparoscope.  All trochars were removed.    Using the Nazhat suction irrigator, blood was irrigated from the pelvis for better visualized.  Both tubes and ovaries were visualized.  The ectop c was no[place]entuckyted on the [id number] [id number] t[place]Velora [id number] to  there rigth sidewall, the tube was grasped and using the Harmonic scalpel, the tube was excised from the ovary and then along the mesosalpinx.  Keeping close to the uterus, the tube was then transected at the cornua.  At this point, there was no additional bleeding.  However, there was still a significant amount of blood in the pelvis so some time was spent removing blood and clot and as much from the pericolic gutters as  possible.  Then using an Endocatch bag, the specimen was placed in the bag and brought up to the LLQ port.  It was too large to remove intact so was removed in pieces by grasping with a Kocher.  Ultimatey the bag could be removed from the incision.  Its contents and all tissue removed were sent together to pathology.  Port was replaced in this incision.  Pt was placed in reverse Trendelenburg to try and get any remaing blood from the upper abdomen.  The pelvic was irrigated.  CO2 pressures were reduced to 25mm Hg to monitor the surgical sites along the mesosalpinx to ensure there was no bleeding.  Excellent hemostasis was noted.  One final irrigation was performed.  No additional procedure was needed.  The LLQ port was removed and the incision was closed with a Carter-Thommason fascial closure device.  Excellent closure was noted.  The pneumoperitoneum was relieved.  Pt was taken out of Trendelenburg and several deep breaths were given to try and remove any CO2 gas.  Remaining ports were removed.  The skin was then closed with subcuticular stitches of 3-0 Vicryl. The skin was cleansed Dermabond was applied.   Attention was then turned the vagina and the Hulka clamp removed.  The foley was removed as well.  Sponge, lap, needle, initially counts were correct x2. Patient tolerated the procedure very well. She was awakened from anesthesia, extubated and taken to recovery in stable condition.   COUNTS:  YES  PLAN OF CARE: Transfer to PACU

## 2018-10-20 NOTE — Anesthesia Preprocedure Evaluation (Addendum)
Anesthesia Evaluation  Patient identified by MRN, date of birth, ID band Patient awake    Reviewed: Allergy & Precautions, NPO status , Patient's Chart, lab work & pertinent test results  History of Anesthesia Complications Negative for: history of anesthetic complications  Airway Mallampati: I  TM Distance: >3 FB Neck ROM: Full    Dental  (+) Dental Advisory Given   Pulmonary Patient abstained from smoking., former smoker,    breath sounds clear to auscultation       Cardiovascular negative cardio ROS   Rhythm:Regular Rate:Normal     Neuro/Psych negative neurological ROS     GI/Hepatic negative GI ROS, Neg liver ROS,   Endo/Other  negative endocrine ROS  Renal/GU negative Renal ROS     Musculoskeletal   Abdominal   Peds  Hematology negative hematology ROS (+)   Anesthesia Other Findings   Reproductive/Obstetrics (+) Pregnancy (ruptured ectopic)                            Anesthesia Physical Anesthesia Plan  ASA: II and emergent  Anesthesia Plan: General   Post-op Pain Management:    Induction: Rapid sequence and Cricoid pressure planned  PONV Risk Score and Plan: 3 and Ondansetron, Dexamethasone and Scopolamine patch - Pre-op  Airway Management Planned: Oral ETT  Additional Equipment:   Intra-op Plan:   Post-operative Plan: Extubation in OR  Informed Consent: I have reviewed the patients History and Physical, chart, labs and discussed the procedure including the risks, benefits and alternatives for the proposed anesthesia with the patient or authorized representative who has indicated his/her understanding and acceptance.     Dental advisory given  Plan Discussed with: CRNA and Surgeon  Anesthesia Plan Comments:        Anesthesia Quick Evaluation

## 2018-10-20 NOTE — ED Provider Notes (Signed)
[facility] EMERGENCY DEPARTMENT Provider Note   CSN: [id number] Arrival date & time: 10/20/18  [id number]     History   Chief Complaint Chief Complaint  Patient presents with  . Abdominal Pain    HPI Monica Bridges is a 28 y.o. female  with history of ASCUS, ovarian cysts who presents with bilateral lower abdominal pain, worse on the left.  Patient has known ovarian cysts and had an ultrasound last month which showed a cyst on the right.  Patient reports her pain feels similar, but worse.  She describes the pain is sharp and stabbing with cramping.  She denies any fevers, chest pain, shortness of breath.  She has had nausea, but no vomiting or diarrhea.  She denies any dysuria.  She reports she has had some brown bleeding, however her OB/GYN told her this was normal with ruptured cyst, so she has not thought much of it.  She has an IUD.     HPI  Past Medical History:  Diagnosis Date  . 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 Depression   . 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 OSA (obstructive sleep apnea)   . STD (sexually transmitted disease) 09/08/2015   Chlamydia     Patient Active Problem List   Diagnosis Date Noted  . History of abnormal Pap smear 07/26/2012  . Depression     Past Surgical History:  Procedure Laterality Date  . COLPOSCOPY  7/12   CIN I  . WISDOM TOOTH EXTRACTION       OB History    Gravida  0   Para  0   Term  0   Preterm  0   AB  0   Living  0     SAB  0   TAB  0   Ectopic  0   Multiple  0   Live Births               Home Medications    Prior to Admission medications   Medication Sig Start Date End Date Taking? Authorizing Provider  gabapentin (NEURONTIN) 300 MG capsule Take 300 mg by mouth once. Patient advised that this was not a prescribed medication. Medication was given to patient by a co-worker.   Yes [provider]   Levonorgestrel (KYLEENA) 19.5 MG IUD by Intrauterine route.   Yes [provider]  methocarbamol (ROBAXIN) 750 MG tablet Take 750 mg by mouth once. Patient advised this was not a prescribed medication. Medication was given to patient by a co-worker.   Yes [provider]  naproxen sodium (ALEVE) 220 MG tablet Take 440 mg by mouth once.   Yes [provider]  Probiotic Product (PROBIOTIC PO) Take by mouth daily.   Yes [provider]    Family History Family History  Problem Relation Age of Onset  . Cancer Maternal Aunt        Lung    Social History Social History   Tobacco Use  . Smoking status: Light Tobacco Smoker    Packs/day: 0.25    Types: Cigarettes  . Smokeless tobacco: Never Used  . Tobacco comment: 3-4 a day  Substance Use Topics  . Alcohol use: Yes    Comment: weekly  . Drug use: Not on file    Comment: occ     Allergies   Patient has no known allergies.   Review of  Systems Review of Systems  Constitutional: Negative for chills and fever.  HENT: Negative for facial swelling and sore throat.   Respiratory: Negative for shortness of breath.   Cardiovascular: Negative for chest pain.  Gastrointestinal: Positive for abdominal pain and nausea. Negative for vomiting.  Genitourinary: Positive for pelvic pain and vaginal bleeding. Negative for dysuria.  Musculoskeletal: Positive for back pain.  Skin: Negative for rash and wound.  Neurological: Negative for headaches.  Psychiatric/Behavioral: The patient is not nervous/anxious.      Physical Exam Updated Vital Signs BP 124/66   Pulse 68   Temp 97.8 F (36.6 C) (Oral)   Resp 18   SpO2 100%   Physical Exam Vitals signs and nursing note reviewed.  Constitutional:      General: She is not in acute distress.    Appearance: She is well-developed. She is not diaphoretic.  HENT:     Head: Normocephalic and atraumatic.     Mouth/Throat:     Pharynx: No oropharyngeal  exudate.  Eyes:     General: No scleral icterus.       Right eye: No discharge.        Left eye: No discharge.     Conjunctiva/sclera: Conjunctivae normal.     Pupils: Pupils are equal, round, and reactive to light.  Neck:     Musculoskeletal: Normal range of motion and neck supple.     Thyroid: No thyromegaly.  Cardiovascular:     Rate and Rhythm: Normal rate and regular rhythm.     Heart sounds: Normal heart sounds. No murmur. No friction rub. No gallop.   Pulmonary:     Effort: Pulmonary effort is normal. No respiratory distress.     Breath sounds: Normal breath sounds. No stridor. No wheezing or rales.  Abdominal:     General: Bowel sounds are normal. There is no distension.     Palpations: Abdomen is soft.     Tenderness: There is abdominal tenderness (L>R) in the right lower quadrant, suprapubic area and left lower quadrant. There is no right CVA tenderness, left CVA tenderness, guarding or rebound.  Genitourinary:    Comments: Deferred considering dx made on ultrasound prior to completing exam Lymphadenopathy:     Cervical: No cervical adenopathy.  Skin:    General: Skin is warm and dry.     Coloration: Skin is not pale.     Findings: No rash.  Neurological:     Mental Status: She is alert.     Coordination: Coordination normal.      ED Treatments / Results  Labs (all labs ordered are listed, but only abnormal results are displayed) Labs Reviewed  COMPREHENSIVE METABOLIC PANEL - Abnormal; Notable for the following components:      Result Value   CO2 19 (*)    Glucose, Bld 110 (*)    Total Bilirubin 1.3 (*)    All other components within normal limits  CBC WITH DIFFERENTIAL/PLATELET - Abnormal; Notable for the following components:   MCV 101.3 (*)    All other components within normal limits  URINALYSIS, ROUTINE W REFLEX MICROSCOPIC - Abnormal; Notable for the following components:   Hgb urine dipstick MODERATE (*)    Ketones, ur 5 (*)    All other components  within normal limits  I-STAT BETA HCG BLOOD, ED ([facility], [facility], [facility] ONLY) - Abnormal; Notable for the following components:   I-stat hCG, quantitative 1,467.8 (*)    All other components within normal limits  WET PREP,  GENITAL  SARS CORONAVIRUS 2  TYPE AND SCREEN  ABO/RH  GC/CHLAMYDIA PROBE AMP ([place]) NOT AT [facility]    EKG None  Radiology [place] Ob Less Than 14 Weeks With Ob Transvaginal  Result Date: 10/20/2018 CLINICAL DATA:  Patient with pelvic pain. EXAM: OBSTETRIC <14 WK US AND TRANSVAGINAL OB US TECHNIQUE: Both transabdominal and transvaginal ultrasound examinations were performed for complete evaluation of the gestation as well as the maternal uterus, adnexal regions, and pelvic cul-de-sac. Transvaginal technique was performed to assess early pregnancy. COMPARISON:  None. FINDINGS: Intrauterine gestational sac: None Yolk sac:  Not Visualized. Embryo:  Not Visualized. Cardiac Activity: Not Visualized. Maternal uterus/adnexae: There is a complex mass posterior to the uterus which extends from the right to left adnexa. This is difficult to measure given the amorphous nature. Intrauterine device is present within the endometrial canal. Small cyst within the right ovary. IMPRESSION: Complex heterogeneous mass posterior to the uterus concerning for the ectopic pregnancy with associated hemorrhage/blood products. Critical Value/emergent results were called by telephone at the time of interpretation on 10/20/2018 at 10:07 am to Dr. Rosalia Hammersay, who verbally acknowledged these results. Electronically Signed   By: Annia Beltrew  Davis M.D.   On: 10/20/2018 10:11    Procedures .Critical Care Performed by: Emi HolesLaw, Donicia Druck M, PA-C Authorized by: Emi HolesLaw, Annina Piotrowski M, PA-C   Critical care provider statement:    Critical care time (minutes):  45   Critical care was necessary to treat or prevent imminent or life-threatening deterioration of the following conditions:  Circulatory failure (ruptured ectopic pregnancy)   Critical  care was time spent personally by me on the following activities:  Discussions with consultants, evaluation of patient's response to treatment, examination of patient, ordering and performing treatments and interventions, ordering and review of laboratory studies, ordering and review of radiographic studies, pulse oximetry, re-evaluation of patient's condition, obtaining history from patient or surrogate, review of old charts and discussions with primary provider   I assumed direction of critical care for this patient from another provider in my specialty: no     (including critical care time)  Medications Ordered in ED Medications  morphine 4 MG/ML injection 4 mg (4 mg Intravenous Given 10/20/18 0806)  ondansetron (ZOFRAN) injection 4 mg (4 mg Intravenous Given 10/20/18 0804)  morphine 4 MG/ML injection 4 mg (4 mg Intravenous Given 10/20/18 0945)  sodium chloride 0.9 % bolus 1,000 mL (1,000 mLs Intravenous New Bag/Given 10/20/18 1027)  propofol (DIPRIVAN) 10 mg/mL bolus/IV push (has no administration in time range)  fentaNYL (SUBLIMAZE) 250 MCG/5ML injection (has no administration in time range)  midazolam (VERSED) 2 MG/2ML injection (has no administration in time range)     Initial Impression / Assessment and Plan / ED Course  I have reviewed the triage vital signs and the nursing notes.  Pertinent labs & imaging results that were available during my care of the patient were reviewed by me and considered in my medical decision making (see chart for details).        Patient presenting with lower abdominal pain that began acutely prior to arrival.  She is found to have a ruptured ectopic pregnancy.  Patient's pain controlled with morphine.  Vital signs are stable.  Hemoglobin is 13.2.  Pelvic ultrasound shows complex heterogeneous mass posterior to the uterus concerning for ectopic pregnancy with associated hemorrhage/blood products.  I consulted patient's OB/GYN, Dr. Hyacinth MeekerMiller, who evaluated the  patient and will take to the operating room.  Patient is Rh+.  During patient's ED  course, she tried to get up to go the bathroom and had a syncopal episode and fell onto the bed.  She states that she did not hit her head.  This was unwitnessed.  She denies any pain or headache.  Will defer imaging at this time.  This occurred right after she was given morphine.  IV fluids ordered.  COVID swab ordered.  Transfer of care to OB/GYN, Dr. Sabra Heck.  I appreciate her assistance with the patient.  Patient also evaluated by my attending, Dr. Jeanell Sparrow, who guided the patient's management and agrees with plan.  Final Clinical Impressions(s) / ED Diagnoses   Final diagnoses:  Ruptured ectopic pregnancy    ED Discharge Orders    None       Frederica Kuster, Hershal Coria 10/20/18 1136    Pattricia Boss, MD 10/20/18 [phone]

## 2018-10-20 NOTE — Progress Notes (Signed)
Wasted .75mg  Dilaudid with Carlus Pavlov, RN

## 2018-10-20 NOTE — ED Notes (Signed)
Patient transported to Ultrasound 

## 2018-10-20 NOTE — Transfer of Care (Signed)
Immediate Anesthesia Transfer of Care Note  Patient: Monica Bridges  Procedure(s) Performed: DIAGNOSTIC LAPAROSCOPY, LEFT SALPINGECTOMY  WITH REMOVAL OF RUPTURED ECTOPIC PREGNANCY, REMOVAL IUD (N/A Abdomen)  Patient Location: PACU  Anesthesia Type:General  Level of Consciousness: awake, alert  and oriented  Airway & Oxygen Therapy: Patient Spontanous Breathing  Post-op Assessment: Report given to RN and Post -op Vital signs reviewed and stable  Post vital signs: Reviewed and stable  Last Vitals:  Vitals Value Taken Time  BP 129/64 10/20/18 1440  Temp    Pulse 101 10/20/18 1440  Resp 16 10/20/18 1440  SpO2 98 % 10/20/18 1440    Last Pain:  Vitals:   10/20/18 1440  TempSrc:   PainSc: 2          Complications: No apparent anesthesia complications

## 2018-10-20 NOTE — ED Triage Notes (Addendum)
Pt c/o lower abd pain that began about 1 hour ago along with nausea ; denies any vomiting or diarrhea ; pt also c/o brownish discharge that started 3 days ago; pt further states she had a right ovary cyst rupture last month

## 2018-10-20 NOTE — H&P (Signed)
Monica Bridges is an 28 y.o. female  G1P0 here with about six hours of lower pelvic pain.  Pain continued to worsen and she was working an overnight shift.  She starting feeling like she needed to have a bowel movement.  She had an episode of severe pain that lasted about an hour.  She had trouble standing with this episode and she felt she needed to go to the ER.  She's been having dark spotting for about a month.  She was seen in the office about five weeks ago with a corpus luteal cyst.  She does have an IUD that was placed 8/119 by Dr. Edward JollySilva.  She is having associated nausea.  No vomiting. Denies SOB or palpitations.   She came to the ER where pregnancy test is positive.  She HCG is a about 1400.  Ultrasound shows complex pelvic mass extending from right to left with free fluid in the pelvis.  She does have an IUD in place and would like to have this removed today as well.    She works at [employer] on [address].    Pertinent Gynecological History: Menses: irregular Contraception: IUD DES exposure: denies Blood transfusions: none Sexually transmitted diseases: no past history Previous GYN Procedures: none  Last mammogram: n/a Last pap: normal Date: 08/2017 OB History: G1, P0   Menstrual History: No LMP recorded. (Menstrual status: IUD).    Past Medical History:  Diagnosis Date  . 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 Depression   . 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 OSA (obstructive sleep apnea)   . STD (sexually transmitted disease) 09/08/2015   Chlamydia     Past Surgical History:  Procedure Laterality Date  . COLPOSCOPY  7/12   CIN I  . WISDOM TOOTH EXTRACTION      Family History  Problem Relation Age of Onset  . Cancer Maternal Aunt        Lung    Social History:  reports that she has been smoking cigarettes. She has been smoking about 0.25 packs per day. She has never used smokeless  tobacco. She reports current alcohol use.  Drug: Marijuana.  Allergies: No Known Allergies  (Not in a hospital admission)   Review of Systems  Constitutional: Negative.   HENT: Negative.   Eyes: Negative.   Respiratory: Negative.   Cardiovascular: Negative.   Gastrointestinal: Positive for abdominal pain and nausea.  Genitourinary: Negative.   Musculoskeletal: Negative.   Skin: Negative.   Neurological: Negative.   Endo/Heme/Allergies: Negative.   Psychiatric/Behavioral: Negative.     Blood pressure 124/66, pulse 68, temperature 97.8 F (36.6 C), temperature source Oral, resp. rate 18, SpO2 100 %. Physical Exam  Constitutional: She is oriented to person, place, and time. She appears well-developed and well-nourished.  Cardiovascular: Normal rate and regular rhythm.  Respiratory: Effort normal and breath sounds normal.  GI: Soft. She exhibits no distension and no mass. Tenderness: entire lower pelvis. There is no rebound.  Quite bowel sounds  Musculoskeletal: Normal range of motion.  Neurological: She is alert and oriented to person, place, and time.  Skin: Skin is warm and dry.  Psychiatric: She has a normal mood and affect.    Results for orders placed or performed during the hospital encounter of 10/20/18 (from the past 24 hour(s))  Comprehensive metabolic panel     Status: Abnormal   Collection Time: 10/20/18  8:10  AM  Result Value Ref Range   Sodium 137 135 - 145 mmol/L   Potassium 3.6 3.5 - 5.1 mmol/L   Chloride 105 98 - 111 mmol/L   CO2 19 (L) 22 - 32 mmol/L   Glucose, Bld 110 (H) 70 - 99 mg/dL   BUN 11 6 - 20 mg/dL   Creatinine, Ser 0.75 0.44 - 1.00 mg/dL   Calcium 9.3 8.9 - 10.3 mg/dL   Total Protein 7.1 6.5 - 8.1 g/dL   Albumin 4.5 3.5 - 5.0 g/dL   AST 28 15 - 41 U/L   ALT 18 0 - 44 U/L   Alkaline Phosphatase 42 38 - 126 U/L   Total Bilirubin 1.3 (H) 0.3 - 1.2 mg/dL   GFR calc non Af Amer >60 >60 mL/min   GFR calc Af Amer >60 >60 mL/min   Anion gap 13  5 - 15  CBC with Differential     Status: Abnormal   Collection Time: 10/20/18  8:10 AM  Result Value Ref Range   WBC 6.0 4.0 - 10.5 K/uL   RBC 3.93 3.87 - 5.11 MIL/uL   Hemoglobin 13.2 12.0 - 15.0 g/dL   HCT 39.8 36.0 - 46.0 %   MCV 101.3 (H) 80.0 - 100.0 fL   MCH 33.6 26.0 - 34.0 pg   MCHC 33.2 30.0 - 36.0 g/dL   RDW 12.2 11.5 - 15.5 %   Platelets 191 150 - 400 K/uL   nRBC 0.0 0.0 - 0.2 %   Neutrophils Relative % 77 %   Neutro Abs 4.6 1.7 - 7.7 K/uL   Lymphocytes Relative 13 %   Lymphs Abs 0.8 0.7 - 4.0 K/uL   Monocytes Relative 8 %   Monocytes Absolute 0.5 0.1 - 1.0 K/uL   Eosinophils Relative 1 %   Eosinophils Absolute 0.0 0.0 - 0.5 K/uL   Basophils Relative 1 %   Basophils Absolute 0.0 0.0 - 0.1 K/uL   Immature Granulocytes 0 %   Abs Immature Granulocytes 0.02 0.00 - 0.07 K/uL  I-Stat beta hCG blood, ED     Status: Abnormal   Collection Time: 10/20/18  8:21 AM  Result Value Ref Range   I-stat hCG, quantitative 1,467.8 (H) <5 mIU/mL   Comment 3          Type and screen [facility] [place]     Status: None   Collection Time: 10/20/18 10:30 AM  Result Value Ref Range   ABO/RH(D) O POS    Antibody Screen NEG    Sample Expiration      10/23/2018,2359 Performed at [facility], [address], [place], [place] [address]   ABO/Rh     Status: None (Preliminary result)   Collection Time: 10/20/18 10:30 AM  Result Value Ref Range   ABO/RH(D)      O POS Performed at [facility] [address] [place], [place] [address]   Urinalysis, Routine w reflex microscopic     Status: Abnormal   Collection Time: 10/20/18 10:34 AM  Result Value Ref Range   Color, Urine YELLOW YELLOW   APPearance CLEAR CLEAR   Specific Gravity, Urine 1.012 1.005 - 1.030   pH 7.0 5.0 - 8.0   Glucose, UA NEGATIVE NEGATIVE mg/dL   Hgb urine dipstick MODERATE (A) NEGATIVE   Bilirubin Urine NEGATIVE NEGATIVE   Ketones, ur 5 (A) NEGATIVE mg/dL   Protein, ur NEGATIVE NEGATIVE  mg/dL   Nitrite NEGATIVE NEGATIVE   Leukocytes,Ua  NEGATIVE NEGATIVE   RBC / HPF 0-5 0 - 5 RBC/hpf   WBC, UA 0-5 0 - 5 WBC/hpf   Bacteria, UA NONE SEEN NONE SEEN   Squamous Epithelial / LPF 6-10 0 - 5   Mucus PRESENT     [place] Ob Less Than 14 Weeks With Ob Transvaginal  Result Date: 10/20/2018 CLINICAL DATA:  Patient with pelvic pain. EXAM: OBSTETRIC <14 WK US AND TRANSVAGINAL OB US TECHNIQUE: Both transabdominal and transvaginal ultrasound examinations were performed for complete evaluation of the gestation as well as the maternal uterus, adnexal regions, and pelvic cul-de-sac. Transvaginal technique was performed to assess early pregnancy. COMPARISON:  None. FINDINGS: Intrauterine gestational sac: None Yolk sac:  Not Visualized. Embryo:  Not Visualized. Cardiac Activity: Not Visualized. Maternal uterus/adnexae: There is a complex mass posterior to the uterus which extends from the right to left adnexa. This is difficult to measure given the amorphous nature. Intrauterine device is present within the endometrial canal. Small cyst within the right ovary. IMPRESSION: Complex heterogeneous mass posterior to the uterus concerning for the ectopic pregnancy with associated hemorrhage/blood products. Critical Value/emergent results were called by telephone at the time of interpretation on 10/20/2018 at 10:07 am to Dr. Rosalia Hammersay, who verbally acknowledged these results. Electronically Signed   By: Annia Beltrew  Davis M.D.   On: 10/20/2018 10:11    Assessment/Plan: [age] yo G1P0 SWF with ruptured ectopic pregnancy who now required surgical intervention with possible salpingectomy, removal of blood from pelvic, removal of IUD and possible D&C.  Risks Procedure discussed with patient.  Hospital stay, recovery and pain management all discussed.  Risks discussed including but not limited to bleeding, <1% risk of receiving a  transfusion, infection, rare risk of bowel/bladder/ureteral/vascular injury discussed as well as possible need  for additional surgery if injury does occur discussed.  DVT/PE and rare risk of death discussed.  My actual complications with prior surgeries discussed.  Positioning and incision locations discussed.  All questions answered.     Monica Bridges 10/20/2018, 11:47 AM

## 2018-10-20 NOTE — Anesthesia Procedure Notes (Signed)
Procedure Name: Intubation Date/Time: 10/20/2018 12:32 PM Performed by: Eligha Bridegroom, CRNA Pre-anesthesia Checklist: Patient identified, Timeout performed, Patient being monitored, Emergency Drugs available and Suction available Patient Re-evaluated:Patient Re-evaluated prior to induction Oxygen Delivery Method: Circle system utilized Preoxygenation: Pre-oxygenation with 100% oxygen Induction Type: IV induction, Rapid sequence and Cricoid Pressure applied Laryngoscope Size: Mac and 4 Grade View: Grade I Tube type: Oral Tube size: 7.0 mm Number of attempts: 1 Airway Equipment and Method: Stylet Placement Confirmation: ETT inserted through vocal cords under direct vision,  positive ETCO2 and breath sounds checked- equal and bilateral Secured at: 22 cm Tube secured with: Tape Dental Injury: Teeth and Oropharynx as per pre-operative assessment

## 2018-10-21 ENCOUNTER — Telehealth: Payer: Self-pay | Admitting: *Deleted

## 2018-10-21 ENCOUNTER — Encounter (HOSPITAL_COMMUNITY): Payer: Self-pay | Admitting: Obstetrics & Gynecology

## 2018-10-21 NOTE — Telephone Encounter (Signed)
Spoke with patient. Patient states she is a little sore, pain 2/10, but "feeling much better". Describes bleeding as "normal", denies heavy bleeding, has not had to change pad this morning. Post-op OV scheduled for 9/8 at 9:30am with Dr. Sabra Heck. Patient will return call on Wednesday to provide update. Patient thankful for follow-up.   Routing to provider for final review. Patient is agreeable to disposition. Will close encounter.

## 2018-10-21 NOTE — Telephone Encounter (Signed)
-----   Message from Megan Salon, MD sent at 10/21/2018  6:50 AM EDT ----- Regarding: follow up visit Monica Bridges, I did a laparoscopic left salpingectomy on this pt yesterday for a ruptured ectopic.  I would like to see her in two weeks and just get an update today.  Would you please call her and scheduled the 2 week post op.  She may want to go back to work on Thursday so can you ask her to call in Wednesday and just give an update?  Thanks.  Vinnie Level

## 2018-10-23 ENCOUNTER — Telehealth: Payer: Self-pay | Admitting: Obstetrics & Gynecology

## 2018-10-23 NOTE — Telephone Encounter (Signed)
Patient is calling to follow up regarding returning to work after surgery.

## 2018-10-23 NOTE — Telephone Encounter (Signed)
I think it is fine if she can be careful about lifting and if she gets tired, could go home.  Just don't push too hard.  Thanks.

## 2018-10-23 NOTE — Telephone Encounter (Signed)
Call to patient. 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 Sabra Heck is comfortable with her going back to work tomorrow with respecting to "lifting restrictions." States her pain is well controlled. Still taking percocet twice a day and ibuprofen every 8 hours. RN advised patient should not drive while still taking percocet. Patient verbalized understanding. Patient denies fever, and states that she is tolerating food and has an appetite. Has been taking small walks around her neighborhood and "I don't feel like that's too much." Patient states, "I don't see any reason not to go back tomorrow. I just wanted to confirm she was comfortable with that." RN advised would review with Dr. Sabra Heck and return call with recommendations. Patient agreeable.   Routing to provider for review.

## 2018-10-23 NOTE — Telephone Encounter (Signed)
Call to patient. Message given to patient as seen below from Dr. Sabra Heck and patient verbalized understanding. Patient states she has a good support system at work so they will be mindful of her restrictions.   Routing to provider and will close encounter.

## 2018-10-24 ENCOUNTER — Telehealth: Payer: Self-pay | Admitting: Obstetrics & Gynecology

## 2018-10-24 DIAGNOSIS — Z30430 Encounter for insertion of intrauterine contraceptive device: Secondary | ICD-10-CM

## 2018-10-24 DIAGNOSIS — Z3009 Encounter for other general counseling and advice on contraception: Secondary | ICD-10-CM

## 2018-10-24 NOTE — Telephone Encounter (Signed)
Spoke with patient, advised as seen below per Dr. Sabra Heck. Letter sent, patient will print from [place].  11/05/18 post-op f/u changes to procedure visit, order placed for nexplanon insertion. Patient thankful for return call. Questions answered.   Routing to provider for final review. Patient is agreeable to disposition. Will close encounter.

## 2018-10-24 NOTE — Telephone Encounter (Signed)
Patient is calling with questions following surgery.

## 2018-10-24 NOTE — Telephone Encounter (Signed)
Spoke with patient. S/p DIAGNOSTIC LAPAROSCOPY, LEFT SALPINGECTOMY WITH REMOVAL OF RUPTURED ECTOPIC PREGNANCY, REMOVAL IUD on 10/20/18.   1. Is hearing pulse in ears regularly, has not noticed this before. Does not feel like heart rate is faster or slower than normal. Has not taken any hydrocodone today and seems to have improved. Only taking ibuprofen. Reports some soreness at incision sites, "resolving nicely". Bleeding is spotting to none. Recommendations?   2. Asking if nexplanon can be inserted at post op visit on 9/8?   3. Returning to work Midwife, requesting a [employer]. Will plan to print off of MyChart, does not need signed copy.   Advised will review with Dr. Sabra Heck and return call, patient agreeable.   Letter pended.   Dr. Sabra Heck -please review and advise.

## 2018-10-24 NOTE — Telephone Encounter (Signed)
She likely has some mild anemia due to the amount of bleeding with the ectopic.  She should take iron or a MVI with iron.  I will check her hb at her post op visit.    Ok to give note to work Midwife.  Yes, we should plan nexplanon placement at post op visit.  Please put on schedule with this and I will ask for precert for her to be sure of coverage.

## 2018-10-29 ENCOUNTER — Telehealth: Payer: Self-pay | Admitting: Obstetrics & Gynecology

## 2018-10-29 NOTE — Telephone Encounter (Signed)
Call placed to convey benefits for Nexplanon. Spoke with patient and conveyed benefits. Patient understands/agreeable with the benefits. Patient is aware of the cancellation policy. Appointment scheduled 11/05/18

## 2018-11-05 ENCOUNTER — Encounter: Payer: Self-pay | Admitting: Obstetrics & Gynecology

## 2018-11-05 ENCOUNTER — Ambulatory Visit (INDEPENDENT_AMBULATORY_CARE_PROVIDER_SITE_OTHER): Payer: BC Managed Care – PPO | Admitting: Obstetrics & Gynecology

## 2018-11-05 ENCOUNTER — Other Ambulatory Visit: Payer: Self-pay

## 2018-11-05 ENCOUNTER — Telehealth: Payer: Self-pay | Admitting: Obstetrics & Gynecology

## 2018-11-05 VITALS — BP 102/56 | HR 88 | Temp 97.3°F | Ht 70.5 in | Wt 177.0 lb

## 2018-11-05 DIAGNOSIS — Z3009 Encounter for other general counseling and advice on contraception: Secondary | ICD-10-CM

## 2018-11-05 DIAGNOSIS — N912 Amenorrhea, unspecified: Secondary | ICD-10-CM

## 2018-11-05 LAB — POCT URINE PREGNANCY: Preg Test, Ur: NEGATIVE

## 2018-11-05 NOTE — Progress Notes (Signed)
28 y.o. G0P0000 Caucasian Single female presents for Nexplanon insertion and for post op check after right salpingectomy due to ruptured ectopic.  She is back to work.  Bowel and bladder function is normal.  Off pain medication.   She has her IUD removed at time of surgery due to failure.  Wants Nexplanon placement today.  Options for other contraception have been reviewed including risks and benefits.  Pt feels this is best option for her.  LMP:  10/19/3028.  Has not been sexually active since before surgery.  UPT negative today.  Current contraception: abstinence   After all questions were answered, consent was obtained.    Past Medical History:  Diagnosis Date  . 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 Depression   . 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 OSA (obstructive sleep apnea)   . STD (sexually transmitted disease) 09/08/2015   Chlamydia     Past Surgical History:  Procedure Laterality Date  . COLPOSCOPY  7/12   CIN I  . DIAGNOSTIC LAPAROSCOPY WITH REMOVAL OF ECTOPIC PREGNANCY N/A 10/20/2018   Procedure: DIAGNOSTIC LAPAROSCOPY, LEFT SALPINGECTOMY  WITH REMOVAL OF RUPTURED ECTOPIC PREGNANCY, REMOVAL IUD;  Surgeon: Megan Salon, MD;  Location: [facility];  Service: Gynecology;  Laterality: N/A;  . WISDOM TOOTH EXTRACTION      Current Outpatient Medications on File Prior to Visit  Medication Sig Dispense Refill  . ibuprofen (ADVIL) 800 MG tablet Take 1 tablet (800 mg total) by mouth every 8 (eight) hours as needed. 30 tablet 0  . Probiotic Product (PROBIOTIC PO) Take by mouth daily.    . naproxen sodium (ALEVE) 220 MG tablet Take 440 mg by mouth once.     No current facility-administered medications on file prior to visit.    No Known Allergies  Vitals:   11/05/18 0945  BP: (!) 102/56  Pulse: 88  Temp: (!) 97.3 F (36.3 C)   Physical Exam  Constitutional: She appears well-developed and well-nourished.  GI:  Soft. Bowel sounds are normal. She exhibits no distension and no mass. There is no abdominal tenderness. There is no rebound and no guarding.  Inc:  C/D/I   Procedure: Patient placed supine on exam table with her left arm flexed at the elbow and externally rotated.  The insertion site was identified on the lower side of upper arm.  Two marks were made 10cm above the medial epicondyle of the humerus.  The first mark for insertion and the second mark 4cm from the first. The insertion sited was cleansed with betadine solution. 3 ml of 1% Lidocaine was injected along the track of the insertion site. Lot:  [id number].  Exp:  05/2020.  The Nexplanon, under sterile conditions, was inserted in left arm. The implant was palpated in the arm after insertion. A small adhesive bandage placed over insertion site. The patient palpated the device for monitoring of the device.  No active bleeding was noted.  A pressure bandage was place over the site.  Pt tolerated procedure well.  Device lot #:  [id number]  Exp: 12/24/2020  Assessment: s/p right salpingectomy due to ruptured ectopic pregnancy Nexplanon insertion for contraceptive needs  Plan  Pt knows to call back with nay questions or concerns.  She is aware of signs/symptoms to watch for over the next few weeks. Pt aware Nexplanon should not be removed any later than 11/04/2021 and that she needs to use a back  up method for the next 7 days..Marland Kitchen

## 2018-11-05 NOTE — Progress Notes (Deleted)
Post Operative Visit  Procedure: Diagnostic Laparoscopy, Left Salpingectomy with Removal of Ruptured Ectopic Pregnancy, Removal IUD.  Days Post-op: 16  Subjective: ***  Objective: BP (!) 102/56   Pulse 88   Temp (!) 97.3 F (36.3 C) (Temporal)   Ht 5' 10.5" (1.791 m)   Wt 177 lb (80.3 kg)   BMI 25.04 kg/m   EXAM General: {Exam; general:16600} Resp: {Exam; lung:16931} Cardio: {Exam; heart:5510} GI: {Exam, YB:0175102} Extremities: {Exam; extremity:5109} Vaginal Bleeding: {exam; vaginal bleeding:3041122}  Assessment: s/p ***  Plan: Recheck {NUMBER 1-10:22536} weeks ***

## 2018-11-05 NOTE — Telephone Encounter (Signed)
Patient sent the following message through [facility]. Routing to triage to assist patient with request.  [facility]  Phone Number: [phone]        hey doc, completely forgot to ask- is my weight restriction lifted? if so, can you send me a "formal document" I can pass along to my employer?   thank you so much!   Monica Bridges

## 2018-11-06 ENCOUNTER — Encounter: Payer: Self-pay | Admitting: Obstetrics & Gynecology

## 2018-11-06 NOTE — Telephone Encounter (Signed)
Responded to pt via mychart.  Encounter closed.

## 2018-11-11 ENCOUNTER — Ambulatory Visit: Payer: BC Managed Care – PPO | Admitting: Certified Nurse Midwife

## 2019-03-04 ENCOUNTER — Other Ambulatory Visit: Payer: Self-pay

## 2019-03-04 ENCOUNTER — Encounter: Payer: Self-pay | Admitting: Obstetrics & Gynecology

## 2019-03-04 ENCOUNTER — Other Ambulatory Visit (HOSPITAL_COMMUNITY)
Admission: RE | Admit: 2019-03-04 | Discharge: 2019-03-04 | Disposition: A | Discharge status: Home / Self Care | Payer: BC Managed Care – PPO | Source: Ambulatory Visit | Attending: Obstetrics & Gynecology | Admitting: Obstetrics & Gynecology

## 2019-03-04 ENCOUNTER — Ambulatory Visit: Payer: BC Managed Care – PPO | Admitting: Obstetrics & Gynecology

## 2019-03-04 VITALS — BP 118/60 | HR 80 | Temp 97.5°F | Resp 12 | Ht 70.0 in | Wt 195.4 lb

## 2019-03-04 DIAGNOSIS — Z01419 Encounter for gynecological examination (general) (routine) without abnormal findings: Secondary | ICD-10-CM | POA: Diagnosis not present

## 2019-03-04 DIAGNOSIS — Z124 Encounter for screening for malignant neoplasm of cervix: Secondary | ICD-10-CM | POA: Diagnosis not present

## 2019-03-04 NOTE — Progress Notes (Signed)
29 y.o. G0P0000 Single White or Caucasian female here for annual exam.  She has a Nexplanon that was placed 11/05/2018.  She has a light two to three day cycle.  She is having some depression issues that she does not feel is related to the Nexplanon.  She is not on any medication.  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    Patient's last menstrual period was 02/25/2019.          Sexually active: Yes.    The current method of family planning is Nexplanon inserted September 2020.    Exercising: Yes.    cardio, walking, jogging Smoker:  yes  Health Maintenance: Pap:  09/11/17 Neg  09/06/15 Neg:Neg HR HPV  09/25/14 Biopsy showed LGSIL  09/02/14 Neg:Pos HR HPV History of abnormal Pap:  yes TDaP:  2015 Screening Labs: discuss today   reports that she has been smoking cigarettes. She has been smoking about 0.25 packs per day. She has never used smokeless tobacco. She reports current alcohol use. She reports current drug use. Drug: Marijuana.  Past Medical History:  Diagnosis Date  . 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 Depression   . 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 OSA (obstructive sleep apnea)   . STD (sexually transmitted disease) 09/08/2015   Chlamydia     Past Surgical History:  Procedure Laterality Date  . COLPOSCOPY  7/12   CIN I  . DIAGNOSTIC LAPAROSCOPY WITH REMOVAL OF ECTOPIC PREGNANCY N/A 10/20/2018   Procedure: DIAGNOSTIC LAPAROSCOPY, LEFT SALPINGECTOMY  WITH REMOVAL OF RUPTURED ECTOPIC PREGNANCY, REMOVAL IUD;  Surgeon: Megan Salon, MD;  Location: [facility];  Service: Gynecology;  Laterality: N/A;  . WISDOM TOOTH EXTRACTION      Current Outpatient Medications  Medication Sig Dispense Refill  . etonogestrel (NEXPLANON) 56 MG IMPL implant     . Probiotic Product (PROBIOTIC PO) Take by mouth daily.    Marland Kitchen ibuprofen (ADVIL) 800 MG tablet Take 1 tablet (800 mg total) by mouth every 8 (eight) hours as  needed. (Patient not taking: Reported on 03/04/2019) 30 tablet 0  . naproxen sodium (ALEVE) 220 MG tablet Take 440 mg by mouth once.     No current facility-administered medications for this visit.    Family History  Problem Relation Age of Onset  . Cancer Maternal Aunt        Lung    Review of Systems  All other systems reviewed and are negative.   Exam:   BP 118/60 (BP Location: Right Arm, Patient Position: Sitting, Cuff Size: Normal)   Pulse 80   Temp (!) 97.5 F (36.4 C) (Temporal)   Resp 12   Ht 5\' 10"  (1.778 m)   Wt 195 lb 6.4 oz (88.6 kg)   LMP 02/25/2019   BMI 28.04 kg/m    Height: 5\' 10"  (177.8 cm)  Ht Readings from Last 3 Encounters:  03/04/19 5\' 10"  (1.778 m)  11/05/18 5' 10.5" (1.791 m)  09/10/18 5' 10.5" (1.791 m)    General appearance: alert, cooperative and appears stated age Head: Normocephalic, without obvious abnormality, atraumatic Neck: no adenopathy, supple, symmetrical, trachea midline and thyroid normal to inspection and palpation Lungs: clear to auscultation bilaterally Breasts: normal appearance, no masses or tenderness Heart: regular rate and rhythm Abdomen: soft, non-tender; bowel sounds normal; no masses,  no organomegaly Extremities: extremities normal, atraumatic, no cyanosis or edema, Nexplanon palpated in left  upper arm, intact Skin: Skin color, texture, turgor normal. No rashes or lesions Lymph nodes: Cervical, supraclavicular, and axillary nodes normal. No abnormal inguinal nodes palpated Neurologic: Grossly normal   Pelvic: External genitalia:  no lesions              Urethra:  normal appearing urethra with no masses, tenderness or lesions              Bartholins and Skenes: normal                 Vagina: normal appearing vagina with normal color and discharge, no lesions              Cervix: no lesions              Pap taken: Yes.   Bimanual Exam:  Uterus:  normal size, contour, position, consistency, mobility, non-tender               Adnexa: normal adnexa and no mass, fullness, tenderness               Rectovaginal: Confirms               Anus:  normal sphincter tone, no lesions  Chaperone, Zenovia Jordan, CMA, was present for exam.  A:  Well Woman with normal exam Nexplanon for contraception H/o ruptured ectopic pregnancy s/p laparoscopic left salpingectomy and removal of IUD H/o LGSIL pap 4/12 H/o chlamydia in 2017, now in long term relationship H/o HSV 1  P:   Mammogram guidelines reviewed.  Imaging not indicated at HR HPV obtained obtained today for cervical cancer screening No lab work indicated today Return annually or prn

## 2019-03-05 ENCOUNTER — Encounter: Payer: Self-pay | Admitting: Obstetrics & Gynecology

## 2019-03-05 LAB — CERVICOVAGINAL ANCILLARY ONLY
Comment: NEGATIVE
High risk HPV: NEGATIVE

## 2019-05-10 ENCOUNTER — Ambulatory Visit: Payer: BC Managed Care – PPO | Attending: Internal Medicine

## 2019-05-10 DIAGNOSIS — Z23 Encounter for immunization: Secondary | ICD-10-CM

## 2019-05-10 NOTE — Progress Notes (Signed)
   Covid-19 Vaccination Clinic  Name:  Monica Bridges    MRN: [id number] DOB: 1990-10-23  05/10/2019  [name] was observed post Covid-19 immunization for 15 minutes without incident. She was provided with Vaccine Information Sheet and instruction to access the V-Safe system.   [name] was instructed to call 911 with any severe reactions post vaccine: Marland Kitchen Difficulty breathing  . Swelling of face and throat  . A fast heartbeat  . A bad rash all over body  . Dizziness and weakness   Immunizations Administered    Name Date Dose VIS Date Route   Pfizer COVID-19 Vaccine 05/10/2019  1:46 PM 0.3 mL 02/07/2019 Intramuscular   Manufacturer: [employer], [employer]   Lot: [id number]   NDC: [address]-3

## 2019-05-12 ENCOUNTER — Encounter: Payer: Self-pay | Admitting: Certified Nurse Midwife

## 2019-05-14 ENCOUNTER — Encounter: Payer: Self-pay | Admitting: Certified Nurse Midwife

## 2019-06-03 ENCOUNTER — Ambulatory Visit: Payer: BC Managed Care – PPO | Attending: Internal Medicine

## 2019-06-03 DIAGNOSIS — Z23 Encounter for immunization: Secondary | ICD-10-CM

## 2019-06-03 NOTE — Progress Notes (Signed)
   Covid-19 Vaccination Clinic  Name:  Monica Bridges    MRN: [id number] DOB: 03-Jan-1991  06/03/2019  [name] was observed post Covid-19 immunization for 15 minutes without incident. She was provided with Vaccine Information Sheet and instruction to access the V-Safe system.   [name] was instructed to call 911 with any severe reactions post vaccine: Marland Kitchen Difficulty breathing  . Swelling of face and throat  . A fast heartbeat  . A bad rash all over body  . Dizziness and weakness   Immunizations Administered    Name Date Dose VIS Date Route   Pfizer COVID-19 Vaccine 06/03/2019  1:26 PM 0.3 mL 02/07/2019 Intramuscular   Manufacturer: [employer], [employer]   Lot: [id number]   NDC: [address]-5

## 2019-08-09 ENCOUNTER — Encounter: Payer: Self-pay | Admitting: Obstetrics & Gynecology

## 2019-08-11 ENCOUNTER — Telehealth: Payer: Self-pay | Admitting: Obstetrics & Gynecology

## 2019-08-11 NOTE — Telephone Encounter (Signed)
Left message to call Noreene Larsson, RN at [facility] [phone].    Last AEX 03/04/19 H/o ruptured ectopic pregnancy s/p laparoscopic left salpingectomy and removal of IUD  Nexplanon placed 11/05/18

## 2019-08-11 NOTE — Telephone Encounter (Signed)
Serena Colonel sent to [facility] Clinical Pool Phone Number: [phone]  My body still has not settled into a discernable menstrual routine after having the Nexplanon implanted. Is that to be expected? Should I give it a full year for my body to adjust before deciding whether or not this birth control is for me? Menstrual bleeding does not seem to follow any schedule that I can track for more than a month or two before it changes again. My menstrual symptoms (cramping, emotional upheaval, diarrhea, headache, mental fog) are ranging from moderate to severe - this afternoon I woke from sleeping with cramps so intense for a split second I was sure I had another cyst rupture. I have attempted to control discomfort with 800mg  ibuprofen, 1000mg  tylenol and warming devices with limited success.  The last thing I want to do is endure the ordeal of trying yet another birth control method because I haven't given my body enough time to adjust but this current situation is not sustainable. Please advise!

## 2019-08-21 NOTE — Telephone Encounter (Signed)
MyChart message to patient.  

## 2019-08-26 ENCOUNTER — Telehealth: Payer: Self-pay | Admitting: Obstetrics & Gynecology

## 2019-08-26 NOTE — Telephone Encounter (Signed)
MyChart message Last read by Serena Colonel at 8:09 AM on 08/21/2019.  No return call from patient.   Routing to covering provider FYI.   Encounter closed.

## 2019-08-26 NOTE — Telephone Encounter (Signed)
Patient would like to speak with nurse about some abnormal issues she's having.

## 2019-08-26 NOTE — Telephone Encounter (Signed)
Spoke with patient. Patient calling in f/u to 08/09/19 MyChart message.  Patient had a nexplanon placed 11/05/18. Reports menses continue to be irregular. Reports cramping, emotional changes, diarrhea, headache and "mental fog" while on menses. Started bleeding again for the second time this month on 6/28, wearing a panty liner, changing q2-3 hours. Pain currently 4/10, describes as intermittent cramping in lower pelvis/abdomen. Has tried OTC motrin and applied heat, provides little relief. She is sexually active, UPT 1 mo ago negative. Patient is requesting an OV with Dr. Hyacinth Meeker to discuss alternative contraceptive options, states she does not feel like the nexplanon is going to be a good option for her. Denies fever/chills, N/V.   OV scheduled for 7/7 at 10:30am w/ Dr. Hyacinth Meeker.  ER/Urgent care precautions provided for new or worsening symptoms. Advised will have covering provider review, our office will return call if any additional recommendations. Patient verbalizes understanding and is agreeable.   Last AEX 03/04/19  Routing to covering provider for final review. Patient is agreeable to disposition. Will close encounter.

## 2019-09-03 ENCOUNTER — Other Ambulatory Visit: Payer: Self-pay

## 2019-09-03 ENCOUNTER — Ambulatory Visit (INDEPENDENT_AMBULATORY_CARE_PROVIDER_SITE_OTHER): Payer: BC Managed Care – PPO | Admitting: Obstetrics & Gynecology

## 2019-09-03 ENCOUNTER — Encounter: Payer: Self-pay | Admitting: Obstetrics & Gynecology

## 2019-09-03 VITALS — BP 102/60 | HR 64 | Resp 12 | Ht 70.0 in | Wt 189.0 lb

## 2019-09-03 DIAGNOSIS — N926 Irregular menstruation, unspecified: Secondary | ICD-10-CM | POA: Diagnosis not present

## 2019-09-03 DIAGNOSIS — F321 Major depressive disorder, single episode, moderate: Secondary | ICD-10-CM | POA: Diagnosis not present

## 2019-09-03 LAB — POCT URINE PREGNANCY: Preg Test, Ur: NEGATIVE

## 2019-09-03 MED ORDER — NORETHINDRONE ACET-ETHINYL EST 1-20 MG-MCG PO TABS: 1.0000 | ORAL_TABLET | Freq: Every day | ORAL | 1 refills | Status: DC

## 2019-09-03 NOTE — Patient Instructions (Addendum)
[facility] [address] [place], [place] [address] [phone] [email or web address] Fax [phone]  Bambi Cottle [facility] [address] [place], [place] [address] [phone]

## 2019-09-03 NOTE — Progress Notes (Signed)
GYNECOLOGY  VISIT  CC:   Patient complains of having irregular bleeding, menstrual cramps, and mood swings since about mid June 2021.  HPI: 29 y.o. G0P0000 Single White or Caucasian female here for irregular bleeding with Nexplanon.  Had ectopic pregnancy with salpingectomy 09/2019.  This occurred with IUD.  Pt then had Nexplanon placed.  Bleeding has been irregular without any pattern.  She reports having increased cramping that tends to occur before bleeding starts.  She has noticed some increased issues with libido.    Reports she went on vacation and there were five individuals at her job who quit.  Work is very stressful right now.  Has noted decreased mood, increased sleeping, decreased interest in life.  Doesn't really think this is related to the Nexplanon but wants to be sure.  She does smoker.  She has worked on decreasing this and she is down to less than half a pack a day.      PHQ-9:  10/27.    GYNECOLOGIC HISTORY: Patient's last menstrual period was 08/27/2019. Contraception: Nexplanon Menopausal hormone therapy: n/a  Patient Active Problem List   Diagnosis Date Noted  . History of abnormal Pap smear 07/26/2012  . Depression     Past Medical History:  Diagnosis Date  . 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 Depression   . 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 OSA (obstructive sleep apnea)   . STD (sexually transmitted disease) 09/08/2015   Chlamydia     Past Surgical History:  Procedure Laterality Date  . COLPOSCOPY  7/12   CIN I  . DIAGNOSTIC LAPAROSCOPY WITH REMOVAL OF ECTOPIC PREGNANCY N/A 10/20/2018   Procedure: DIAGNOSTIC LAPAROSCOPY, LEFT SALPINGECTOMY  WITH REMOVAL OF RUPTURED ECTOPIC PREGNANCY, REMOVAL IUD;  Surgeon: Jerene Bears, MD;  Location: [facility] OR;  Service: Gynecology;  Laterality: N/A;  . WISDOM TOOTH EXTRACTION      MEDS:   Current Outpatient Medications on File Prior to Visit   Medication Sig Dispense Refill  . etonogestrel (NEXPLANON) 68 MG IMPL implant     . ibuprofen (ADVIL) 800 MG tablet Take 1 tablet (800 mg total) by mouth every 8 (eight) hours as needed. 30 tablet 0  . naproxen sodium (ALEVE) 220 MG tablet Take 440 mg by mouth once.    . Probiotic Product (PROBIOTIC PO) Take by mouth daily.     No current facility-administered medications on file prior to visit.    ALLERGIES: Patient has no known allergies.  Family History  Problem Relation Age of Onset  . Cancer Maternal Aunt        Lung    SH:  Single, smoker  Review of Systems  Constitutional:       Mood swings  Genitourinary:       Irregular bleeding Menstrual cramps  All other systems reviewed and are negative.   PHYSICAL EXAMINATION:    BP 102/60 (BP Location: Right Arm, Patient Position: Sitting, Cuff Size: Normal)   Pulse 64   Resp 12   Ht 5\' 10"  (1.778 m)   Wt 189 lb (85.7 kg)   LMP 08/27/2019 Comment: spotting  BMI 27.12 kg/m     General appearance: alert, cooperative and appears stated age  Lymph:  no inguinal LAD noted  Pelvic: External genitalia:  Left marble sized Bartholin's cyst noted              Urethra:  normal appearing urethra with  no masses, tenderness or lesions              Bartholins and Skenes: normal                 Vagina: normal appearing vagina with normal color and discharge, no lesions              Cervix: no lesions              Bimanual Exam:  Uterus:  normal size, contour, position, consistency, mobility, non-tender              Adnexa: no mass, fullness, tenderness  Chaperone, Zenovia Jordan, CMA, was present for exam.  Assessment: Irregular bleeding with Nexplanon Moderate depression via PHQ 9 scoring Left Bartholin's cyst  Plan: Pt wants to start with therapy before any medications.  Names provided. Will start Loestrin 1/20 daily.  #3 packs/1RF.  She will give me update in 1 month.  Does not want Nexplanon removed right now.   37  minutes spent with pt total for visit

## 2019-10-07 ENCOUNTER — Telehealth: Payer: Self-pay | Admitting: Obstetrics & Gynecology

## 2019-10-07 ENCOUNTER — Encounter: Payer: Self-pay | Admitting: Obstetrics & Gynecology

## 2019-10-07 NOTE — Telephone Encounter (Signed)
MyChart message to patient.   Routing to Dr. Miller FYI.   Encounter closed.  

## 2019-10-07 NOTE — Telephone Encounter (Signed)
[facility] Clinical Pool Dr. Hyacinth Meeker, I am sorry I am a little late writing to you. The addition of these birth control pills has exceeded my expectations as far as controlling undesirable symptoms associated (presumably) with the nexplanon. I feel so much more myself emotionally, I have found renewed interest in healthy habits, and I am not spotting at all. Thank you so much for recommending this treatment as it has seemingly greatly improved my quality of life lately. Please let me know if there's anything else you need from my end.  Thanks again, [employer]

## 2019-10-09 DIAGNOSIS — Z20828 Contact with and (suspected) exposure to other viral communicable diseases: Secondary | ICD-10-CM | POA: Diagnosis not present

## 2019-10-14 ENCOUNTER — Encounter: Payer: Self-pay | Admitting: Obstetrics & Gynecology

## 2019-12-09 ENCOUNTER — Encounter: Payer: Self-pay | Admitting: Obstetrics & Gynecology

## 2020-02-12 ENCOUNTER — Other Ambulatory Visit: Payer: Self-pay | Admitting: Obstetrics & Gynecology

## 2020-02-12 NOTE — Telephone Encounter (Signed)
Medication refill request: Microgestin Last OV:  09/03/19 Dr. Hyacinth Meeker Next AEX: none Last MMG (if hormonal medication request): n/a Refill authorized: today, please advise

## 2020-02-12 NOTE — Telephone Encounter (Signed)
The pill was prescribed for AUB with the nexplanon. We don't typically continue both long term. She should make an appointment to discuss options if she wants to stay on OCP's. She can do this with Dr Hyacinth Meeker if desired.

## 2020-02-16 ENCOUNTER — Other Ambulatory Visit: Payer: Self-pay | Admitting: Obstetrics & Gynecology

## 2020-02-16 NOTE — Telephone Encounter (Signed)
Spoke with pt. Pt states did request Rx refill, but wasn't aware not for long term use. Pt states will try with 1-2 months without taking Rx Microgestin and will follow up with Dr Hyacinth Meeker at [facility] for follow up in Feb.  Routing to Dr Oscar La for review Encounter closed  Rx refill refused.

## 2020-05-04 ENCOUNTER — Telehealth: Payer: Self-pay

## 2020-05-04 NOTE — Telephone Encounter (Signed)
Patient called in voice mail stating she has concerns about a possible Bartholin gland abcess.  I called her back and told her I see that she always saw Dr. Hyacinth Meeker and asked if she is going to stay here at [facility] or follow Dr. Hyacinth Meeker. She said she had meant to call Dr. Rondel Baton office and would like to follow Dr. Hyacinth Meeker for her care. I provided her both phone numbers that I had available.

## 2020-05-07 ENCOUNTER — Other Ambulatory Visit (HOSPITAL_BASED_OUTPATIENT_CLINIC_OR_DEPARTMENT_OTHER): Payer: Self-pay | Admitting: Obstetrics & Gynecology

## 2020-05-07 ENCOUNTER — Encounter (HOSPITAL_BASED_OUTPATIENT_CLINIC_OR_DEPARTMENT_OTHER): Payer: Self-pay | Admitting: Obstetrics & Gynecology

## 2020-05-07 ENCOUNTER — Ambulatory Visit (INDEPENDENT_AMBULATORY_CARE_PROVIDER_SITE_OTHER): Payer: BC Managed Care – PPO | Admitting: Obstetrics & Gynecology

## 2020-05-07 ENCOUNTER — Other Ambulatory Visit (HOSPITAL_COMMUNITY)
Admission: RE | Admit: 2020-05-07 | Discharge: 2020-05-07 | Disposition: A | Discharge status: Home / Self Care | Payer: BC Managed Care – PPO | Source: Ambulatory Visit | Attending: Obstetrics & Gynecology | Admitting: Obstetrics & Gynecology

## 2020-05-07 ENCOUNTER — Other Ambulatory Visit: Payer: Self-pay

## 2020-05-07 ENCOUNTER — Encounter (HOSPITAL_COMMUNITY): Payer: Self-pay | Admitting: Obstetrics & Gynecology

## 2020-05-07 VITALS — BP 122/78 | Ht 71.0 in | Wt 187.8 lb

## 2020-05-07 DIAGNOSIS — N751 Abscess of Bartholin's gland: Secondary | ICD-10-CM

## 2020-05-07 DIAGNOSIS — Z793 Long term (current) use of hormonal contraceptives: Secondary | ICD-10-CM | POA: Diagnosis not present

## 2020-05-07 DIAGNOSIS — Z20822 Contact with and (suspected) exposure to covid-19: Secondary | ICD-10-CM | POA: Insufficient documentation

## 2020-05-07 DIAGNOSIS — Z01812 Encounter for preprocedural laboratory examination: Secondary | ICD-10-CM | POA: Insufficient documentation

## 2020-05-07 DIAGNOSIS — F17210 Nicotine dependence, cigarettes, uncomplicated: Secondary | ICD-10-CM | POA: Diagnosis not present

## 2020-05-07 DIAGNOSIS — N750 Cyst of Bartholin's gland: Secondary | ICD-10-CM | POA: Diagnosis not present

## 2020-05-07 LAB — SARS CORONAVIRUS 2 (TAT 6-24 HRS): SARS Coronavirus 2: NEGATIVE

## 2020-05-07 MED ORDER — CEPHALEXIN 500 MG PO CAPS: 500.0000 mg | ORAL_CAPSULE | Freq: Four times a day (QID) | ORAL | 0 refills | Status: DC

## 2020-05-07 NOTE — Progress Notes (Signed)
EKG: na CXR: na ECHO: denies Stress Test: denies Cardiac Cath: denies  Fasting Blood Sugar- na Checks Blood Sugar_na__ times a day  OSA:  Patient states started study, but did not finish.  CPAP: No  ASA/Blood Thinners:  No  Covid test 05/06/20  Anesthesia Review: No  Patient denies shortness of breath, fever, cough, and chest pain at PAT appointment.  Patient verbalized understanding of instructions provided today at the PAT appointment.  Patient asked to review instructions at home and day of surgery.

## 2020-05-07 NOTE — Patient Instructions (Signed)
[address] up [employer] testing

## 2020-05-07 NOTE — Progress Notes (Signed)
GYNECOLOGY  VISIT  CC:   Recurrent bartholin's abscess  HPI: 30 y.o. G0P0000 Single White or Caucasian female here for recurrent left bartholin's abscess.  She's had this before and needed I&D in the office.  It started about two days ago and is tender and enlarged.  It's not as bad, at this point, as in the past.  It has been I&Ded in the past on the same side.  She is desirous of more definitive treatment then just I&D if possible.  GYNECOLOGIC HISTORY: No LMP recorded. Patient has had an implant. Contraception: Nexplanon  Patient Active Problem List   Diagnosis Date Noted  . History of abnormal Pap smear 07/26/2012  . Depression     Past Medical History:  Diagnosis Date  . 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 Depression   . 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 OSA (obstructive sleep apnea)   . STD (sexually transmitted disease) 09/08/2015   Chlamydia     Past Surgical History:  Procedure Laterality Date  . COLPOSCOPY  7/12   CIN I  . DIAGNOSTIC LAPAROSCOPY WITH REMOVAL OF ECTOPIC PREGNANCY N/A 10/20/2018   Procedure: DIAGNOSTIC LAPAROSCOPY, LEFT SALPINGECTOMY  WITH REMOVAL OF RUPTURED ECTOPIC PREGNANCY, REMOVAL IUD;  Surgeon: Jerene Bears, MD;  Location: [facility] OR;  Service: Gynecology;  Laterality: N/A;  . WISDOM TOOTH EXTRACTION      MEDS:   Current Outpatient Medications on File Prior to Visit  Medication Sig Dispense Refill  . etonogestrel (NEXPLANON) 68 MG IMPL implant     . ibuprofen (ADVIL) 800 MG tablet Take 1 tablet (800 mg total) by mouth every 8 (eight) hours as needed. 30 tablet 0  . naproxen sodium (ALEVE) 220 MG tablet Take 440 mg by mouth once.    . norethindrone-ethinyl estradiol (LOESTRIN) 1-20 MG-MCG tablet Take 1 tablet by mouth daily. 84 tablet 1  . Probiotic Product (PROBIOTIC PO) Take by mouth daily.     No current facility-administered medications on file prior to visit.    ALLERGIES:  Patient has no known allergies.  Family History  Problem Relation Age of Onset  . Cancer Maternal Aunt        Lung    SH:  Single, light smoker  Review of Systems  Genitourinary:       Vulvar pain, swelling    PHYSICAL EXAMINATION:    BP 122/78   Ht 5\' 11"  (1.803 m)   Wt 187 lb 12.8 oz (85.2 kg)   BMI 26.19 kg/m     General appearance: alert, cooperative and appears stated age Lymph:  no inguinal LAD noted  Pelvic: External genitalia:  Enlarged, 3.5cm left Batholin's abscess, tender to palpation, no skin erythema              Urethra:  normal appearing urethra with no masses, tenderness or lesions              Bartholins and Skenes: normal                 Vagina: normal appearing vagina with normal color and discharge, no lesions             Chaperone, , CMA, was present for exam.  Assessment/Plan: 1. Bartholin's gland abscess - I&D, word catheter use, marsupialization, gland excision all discussed.  Pt desires more definitive treatment.  Personal experiences with all of these discussed.  Risks and benefits reviewed.  Pt decided she would like to proceed with marsupialization of gland.  Called OR and scheduled pt for tomorrow morning.  Pt will need Covid testing today.  Information given.  Bleeding, infection, recurrent, pain all discussed.  Keflex 500mg  QID x 7 days also initiated.

## 2020-05-08 ENCOUNTER — Encounter (HOSPITAL_COMMUNITY)
Admission: RE | Disposition: A | Discharge status: Home / Self Care | Payer: Self-pay | Source: Home / Self Care | Attending: Obstetrics & Gynecology

## 2020-05-08 ENCOUNTER — Encounter (HOSPITAL_COMMUNITY): Payer: Self-pay | Admitting: Obstetrics & Gynecology

## 2020-05-08 ENCOUNTER — Ambulatory Visit (HOSPITAL_COMMUNITY): Payer: BC Managed Care – PPO | Admitting: Certified Registered"

## 2020-05-08 ENCOUNTER — Ambulatory Visit (HOSPITAL_COMMUNITY)
Admission: RE | Admit: 2020-05-08 | Discharge: 2020-05-08 | Disposition: A | Discharge status: Home / Self Care | Payer: BC Managed Care – PPO | Attending: Obstetrics & Gynecology | Admitting: Obstetrics & Gynecology

## 2020-05-08 ENCOUNTER — Other Ambulatory Visit: Payer: Self-pay

## 2020-05-08 DIAGNOSIS — Z793 Long term (current) use of hormonal contraceptives: Secondary | ICD-10-CM | POA: Diagnosis not present

## 2020-05-08 DIAGNOSIS — F329 Major depressive disorder, single episode, unspecified: Secondary | ICD-10-CM | POA: Diagnosis not present

## 2020-05-08 DIAGNOSIS — G4730 Sleep apnea, unspecified: Secondary | ICD-10-CM | POA: Diagnosis not present

## 2020-05-08 DIAGNOSIS — F17210 Nicotine dependence, cigarettes, uncomplicated: Secondary | ICD-10-CM | POA: Diagnosis not present

## 2020-05-08 DIAGNOSIS — N750 Cyst of Bartholin's gland: Secondary | ICD-10-CM | POA: Insufficient documentation

## 2020-05-08 DIAGNOSIS — N751 Abscess of Bartholin's gland: Secondary | ICD-10-CM

## 2020-05-08 DIAGNOSIS — Z20822 Contact with and (suspected) exposure to covid-19: Secondary | ICD-10-CM | POA: Insufficient documentation

## 2020-05-08 HISTORY — PX: BARTHOLIN CYST MARSUPIALIZATION: SHX5383

## 2020-05-08 LAB — CBC
HCT: 41.2 % (ref 36.0–46.0)
Hemoglobin: 13.5 g/dL (ref 12.0–15.0)
MCH: 32.7 pg (ref 26.0–34.0)
MCHC: 32.8 g/dL (ref 30.0–36.0)
MCV: 99.8 fL (ref 80.0–100.0)
Platelets: 254 10*3/uL (ref 150–400)
RBC: 4.13 MIL/uL (ref 3.87–5.11)
RDW: 12.4 % (ref 11.5–15.5)
WBC: 6.5 10*3/uL (ref 4.0–10.5)
nRBC: 0 % (ref 0.0–0.2)

## 2020-05-08 LAB — POCT PREGNANCY, URINE: Preg Test, Ur: NEGATIVE

## 2020-05-08 SURGERY — MARSUPIALIZATION, CYST, BARTHOLIN'S GLAND
Anesthesia: General

## 2020-05-08 MED ORDER — IBUPROFEN 800 MG PO TABS: 800.0000 mg | ORAL_TABLET | Freq: Three times a day (TID) | ORAL | 0 refills | Status: DC | PRN

## 2020-05-08 MED ORDER — ACETAMINOPHEN 500 MG PO TABS
1000.0000 mg | ORAL_TABLET | Freq: Once | ORAL | Status: AC
  Administered 2020-05-08: 1000 mg via ORAL
  Filled 2020-05-08: qty 2

## 2020-05-08 MED ORDER — CHLORHEXIDINE GLUCONATE 0.12 % MT SOLN
15.0000 mL | Freq: Once | OROMUCOSAL | Status: AC
  Administered 2020-05-08: 15 mL via OROMUCOSAL
  Filled 2020-05-08: qty 15

## 2020-05-08 MED ORDER — LIDOCAINE HCL (PF) 1 % IJ SOLN
INTRAMUSCULAR | Status: AC
  Filled 2020-05-08: qty 30

## 2020-05-08 MED ORDER — ONDANSETRON HCL 4 MG/2ML IJ SOLN
INTRAMUSCULAR | Status: DC | PRN
  Administered 2020-05-08: 4 mg via INTRAVENOUS

## 2020-05-08 MED ORDER — LIDOCAINE HCL 1 % IJ SOLN
INTRAMUSCULAR | Status: DC | PRN
  Administered 2020-05-08: 2 mL

## 2020-05-08 MED ORDER — LIDOCAINE 2% (20 MG/ML) 5 ML SYRINGE
INTRAMUSCULAR | Status: DC | PRN
  Administered 2020-05-08: 40 mg via INTRAVENOUS

## 2020-05-08 MED ORDER — DEXAMETHASONE SODIUM PHOSPHATE 10 MG/ML IJ SOLN
INTRAMUSCULAR | Status: DC | PRN
  Administered 2020-05-08: 5 mg via INTRAVENOUS

## 2020-05-08 MED ORDER — MIDAZOLAM HCL 2 MG/2ML IJ SOLN
INTRAMUSCULAR | Status: AC
  Filled 2020-05-08: qty 2

## 2020-05-08 MED ORDER — LACTATED RINGERS IV SOLN: INTRAVENOUS | Status: DC

## 2020-05-08 MED ORDER — FLUCONAZOLE 150 MG PO TABS: 150.0000 mg | ORAL_TABLET | Freq: Once | ORAL | 0 refills | Status: AC

## 2020-05-08 MED ORDER — 0.9 % SODIUM CHLORIDE (POUR BTL) OPTIME
TOPICAL | Status: DC | PRN
  Administered 2020-05-08: 1000 mL

## 2020-05-08 MED ORDER — FENTANYL CITRATE (PF) 250 MCG/5ML IJ SOLN
INTRAMUSCULAR | Status: DC | PRN
  Administered 2020-05-08 (×3): 50 ug via INTRAVENOUS

## 2020-05-08 MED ORDER — HYDROCODONE-ACETAMINOPHEN 5-325 MG PO TABS: 1.0000 | ORAL_TABLET | Freq: Four times a day (QID) | ORAL | 0 refills | Status: DC | PRN

## 2020-05-08 MED ORDER — POVIDONE-IODINE 10 % EX SWAB: 2.0000 "application " | Freq: Once | CUTANEOUS | Status: DC

## 2020-05-08 MED ORDER — MIDAZOLAM HCL 5 MG/5ML IJ SOLN
INTRAMUSCULAR | Status: DC | PRN
  Administered 2020-05-08: 2 mg via INTRAVENOUS

## 2020-05-08 MED ORDER — ORAL CARE MOUTH RINSE: 15.0000 mL | Freq: Once | OROMUCOSAL | Status: AC

## 2020-05-08 MED ORDER — CEFAZOLIN SODIUM-DEXTROSE 2-4 GM/100ML-% IV SOLN
2.0000 g | INTRAVENOUS | Status: AC
  Administered 2020-05-08: 2 g via INTRAVENOUS
  Filled 2020-05-08: qty 100

## 2020-05-08 MED ORDER — CELECOXIB 200 MG PO CAPS
200.0000 mg | ORAL_CAPSULE | Freq: Once | ORAL | Status: AC
  Administered 2020-05-08: 200 mg via ORAL
  Filled 2020-05-08: qty 1

## 2020-05-08 MED ORDER — KETOROLAC TROMETHAMINE 30 MG/ML IJ SOLN
INTRAMUSCULAR | Status: DC | PRN
  Administered 2020-05-08: 30 mg via INTRAVENOUS

## 2020-05-08 MED ORDER — PROPOFOL 10 MG/ML IV BOLUS
INTRAVENOUS | Status: AC
  Filled 2020-05-08: qty 20

## 2020-05-08 MED ORDER — PROPOFOL 10 MG/ML IV BOLUS
INTRAVENOUS | Status: DC | PRN
  Administered 2020-05-08: 200 mg via INTRAVENOUS

## 2020-05-08 MED ORDER — HYDROMORPHONE HCL 1 MG/ML IJ SOLN
0.2500 mg | INTRAMUSCULAR | Status: DC | PRN
Start: 2020-05-08 — End: 2020-05-08
  Administered 2020-05-08: 0.5 mg via INTRAVENOUS

## 2020-05-08 MED ORDER — FENTANYL CITRATE (PF) 250 MCG/5ML IJ SOLN
INTRAMUSCULAR | Status: AC
  Filled 2020-05-08: qty 5

## 2020-05-08 MED ORDER — HYDROMORPHONE HCL 1 MG/ML IJ SOLN
INTRAMUSCULAR | Status: AC
  Filled 2020-05-08: qty 1

## 2020-05-08 SURGICAL SUPPLY — 18 items
BLADE SURG 15 STRL LF DISP TIS (BLADE) ×1 IMPLANT
BLADE SURG 15 STRL SS (BLADE) ×2
ELECT REM PT RETURN 9FT ADLT (ELECTROSURGICAL) ×2
ELECTRODE REM PT RTRN 9FT ADLT (ELECTROSURGICAL) ×1 IMPLANT
GLOVE ECLIPSE 6.5 STRL STRAW (GLOVE) ×4 IMPLANT
GLOVE SURG UNDER POLY LF SZ7 (GLOVE) ×4 IMPLANT
GOWN STRL REUS W/ TWL LRG LVL3 (GOWN DISPOSABLE) ×2 IMPLANT
GOWN STRL REUS W/TWL LRG LVL3 (GOWN DISPOSABLE) ×4
NEEDLE HYPO 22GX1.5 SAFETY (NEEDLE) ×2 IMPLANT
NS IRRIG 1000ML POUR BTL (IV SOLUTION) ×2 IMPLANT
PACK VAGINAL MINOR WOMEN LF (CUSTOM PROCEDURE TRAY) ×2 IMPLANT
PAD OB MATERNITY 4.3X12.25 (PERSONAL CARE ITEMS) ×2 IMPLANT
PENCIL BUTTON HOLSTER BLD 10FT (ELECTRODE) ×2 IMPLANT
SUT VIC AB 2-0 CT1 27 (SUTURE) ×2
SUT VIC AB 2-0 CT1 TAPERPNT 27 (SUTURE) ×1 IMPLANT
TOWEL GREEN STERILE FF (TOWEL DISPOSABLE) ×4 IMPLANT
TUBE CONNECTING 12X1/4 (SUCTIONS) ×2 IMPLANT
YANKAUER SUCT BULB TIP NO VENT (SUCTIONS) ×2 IMPLANT

## 2020-05-08 NOTE — Anesthesia Postprocedure Evaluation (Signed)
Anesthesia Post Note  Patient: Monica Bridges  Procedure(s) Performed: BARTHOLIN CYST MARSUPIALIZATION (N/A )     Patient location during evaluation: PACU Anesthesia Type: General Level of consciousness: awake and alert Pain management: pain level controlled Vital Signs Assessment: post-procedure vital signs reviewed and stable Respiratory status: spontaneous breathing, nonlabored ventilation and respiratory function stable Cardiovascular status: blood pressure returned to baseline and stable Postop Assessment: no apparent nausea or vomiting Anesthetic complications: no   No complications documented.  Last Vitals:  Vitals:   05/08/20 0852 05/08/20 0907  BP: 121/73 140/76  Pulse: (!) 58 61  Resp: 16 14  Temp:  36.7 C  SpO2: 100% 100%    Last Pain:  Vitals:   05/08/20 0907  TempSrc:   PainSc: 0-No pain                 FITZGERALD,W. EDMOND

## 2020-05-08 NOTE — H&P (Signed)
Monica Bridges is an 30 y.o. female G1PA SWF with left recurrent Bartholin's abscess who is desirous of more definitive treatment.  She is here for marsupialization.  We have discussed risks and benefits.  Questions answered.    Pertinent Gynecological History: Menses: flow is light Bleeding: dysfunctional uterine bleeding Contraception: Nexplanon DES exposure: denies Blood transfusions: none Sexually transmitted diseases: h/o chlamydia Previous GYN Procedures: h/o ectopic pregnancy with salpingectomy  Last mammogram: n/a Last pap: normal Date: 7/19 neg with neg HR HPV 03/04/2019 OB History: G1, P1   Menstrual History: No LMP recorded. Patient has had an implant.    Past Medical History:  Diagnosis Date  . 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 Depression   . 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 OSA (obstructive sleep apnea)   . STD (sexually transmitted disease) 09/08/2015   Chlamydia     Past Surgical History:  Procedure Laterality Date  . COLPOSCOPY  7/12   CIN I  . DIAGNOSTIC LAPAROSCOPY WITH REMOVAL OF ECTOPIC PREGNANCY N/A 10/20/2018   Procedure: DIAGNOSTIC LAPAROSCOPY, LEFT SALPINGECTOMY  WITH REMOVAL OF RUPTURED ECTOPIC PREGNANCY, REMOVAL IUD;  Surgeon: Jerene Bears, MD;  Location: [facility] OR;  Service: Gynecology;  Laterality: N/A;  . WISDOM TOOTH EXTRACTION      Family History  Problem Relation Age of Onset  . Cancer Maternal Aunt        Lung    Social History:  reports that she has been smoking cigarettes. She has been smoking about 0.25 packs per day. She has never used smokeless tobacco. She reports current alcohol use. She reports current drug use. Drug: Marijuana.  Allergies: No Known Allergies  Medications Prior to Admission  Medication Sig Dispense Refill Last Dose  . cephALEXin (KEFLEX) 500 MG capsule Take 1 capsule (500 mg total) by mouth 4 (four) times daily. 28 capsule 0 05/08/2020 at  Unknown time  . ibuprofen (ADVIL) 800 MG tablet Take 1 tablet (800 mg total) by mouth every 8 (eight) hours as needed. 30 tablet 0 05/07/2020 at Unknown time  . naproxen sodium (ALEVE) 220 MG tablet Take 440 mg by mouth once.   05/07/2020 at Unknown time  . etonogestrel (NEXPLANON) 68 MG IMPL implant        Review of Systems  All other systems reviewed and are negative.   Blood pressure 105/62, pulse (!) 56, temperature 98.1 F (36.7 C), temperature source Oral, resp. rate 16, SpO2 100 %. Physical Exam Vitals reviewed.  Constitutional:      Appearance: Normal appearance.  Cardiovascular:     Rate and Rhythm: Normal rate and regular rhythm.     Pulses: Normal pulses.     Heart sounds: Normal heart sounds.  Pulmonary:     Effort: Pulmonary effort is normal.     Breath sounds: Normal breath sounds.  Neurological:     General: No focal deficit present.     Mental Status: She is alert.  Psychiatric:        Mood and Affect: Mood normal.     Results for orders placed or performed during the hospital encounter of 05/08/20 (from the past 24 hour(s))  Pregnancy, urine POC     Status: None   Collection Time: 05/08/20  6:26 AM  Result Value Ref Range   Preg Test, Ur NEGATIVE NEGATIVE    No results found.  Assessment/Plan: 30 yo G1A1 SWF with h/o recurrent left bartholin's  abscess here for I&D and probable marsupialization of the gland.  Questions answered.  Pt here and ready to proceed.  Jerene Bears 05/08/2020, 7:09 AM

## 2020-05-08 NOTE — Op Note (Signed)
05/08/2020  8:14 AM  PATIENT:  Monica Bridges  30 y.o. female  PRE-OPERATIVE DIAGNOSIS:  recurrent left bathiolin cyst abscess  POST-OPERATIVE DIAGNOSIS:  Recurrent left bathiolin cyst abscess  PROCEDURE:  Procedure(s): BARTHOLIN CYST MARSUPIALIZATION  SURGEON:  Jerene Bears  ASSISTANTS: OR staff  ANESTHESIA:   general  ESTIMATED BLOOD LOSS: 10cc  BLOOD ADMINISTERED:none   FLUIDS: 700ccLR  UOP: 100cc clear UOP  SPECIMEN:  none  DISPOSITION OF SPECIMEN:  N/A  FINDINGS: enlarged 4cm left batholin's cyst abscess  DESCRIPTION OF OPERATION: Patient was taken to the operating room.  She is placed in the supine position. SCDs were on her lower extremities and functioning properly. General anesthesia with an LMA was administered without difficulty. Dr. Sampson Goon, anesthesia, oversaw case.  Legs were then placed in the [facility] stirrups in the low lithotomy position. The legs were lifted to the high lithotomy position and the Betadine prep was used on the inner thighs perineum and vagina x3. Patient was draped in a normal standard fashion.  Using a #15 blade, an incision was made on the left side outside the hymenal ring and inside the labia majora, inferiorly, until the cyst was identified.  Then the cyst was incised.  Significant purulent drainage was noted.  Cultures were obtained.  Inside of cyst was irrigated with normal saline.  IV antibiotics were given at this point.  Using #2.0 suture, the cyst wall was sutured using single interrupted sutures to the skin.  This was done at the 12, 3, 6, and 9 o'clock positions.  Two additional sutures were added to create and nice opening for drainage.  Minimal bleeding was noted.  The skin was anesthetized with 1% plain lidocaine at this point.    An in and out catheterization with a red rubber Foley catheter was performed. Approximately 100 cc of clear urine was noted. At this point no other procedure was needed and this procedure was ended.   The prep was cleansed of the patient's skin. The legs are positioned back in the supine position. Sponge, lap, needle, initially counts were correct x2. Patient was taken to recovery in stable condition.  COUNTS:  YES  PLAN OF CARE: Transfer to PACU

## 2020-05-08 NOTE — Discharge Instructions (Addendum)
Post-surgical Instructions, Outpatient Surgery  You may expect to feel dizzy, weak, and drowsy for as long as 24 hours after receiving the medicine that made you sleep (anesthetic). For the first 24 hours after your surgery:    Do not drive a car, ride a bicycle, participate in physical activities, or take public transportation until you are done taking narcotic pain medicines or as directed by Dr. Hyacinth Meeker.   Do not drink alcohol or take tranquilizers.   Do not take medicine that has not been prescribed by your physicians.   Do not sign important papers or make important decisions while on narcotic pain medicines.   Have a responsible person with you.   PAIN MANAGEMENT  Motrin 800mg .  (This is the same as 4-200mg  over the counter tablets of Motrin or ibuprofen.)  You may take this every eight hours or as needed for cramping.    Vicodin 5/325mg .  For more severe pain, take one or two tablets every four to six hours as needed for pain control.  (Remember that narcotic pain medications increase your risk of constipation.  If this becomes a problem, you may take an over the counter stool softener like Colace 100mg  up to four times a day.)  DO'S AND DON'T'S  Do not take a tub bath for one week.  You may shower on the first day after your surgery  Do not do any heavy lifting for one to two weeks.  This increases the chance of bleeding.  Do move around as you feel able.  Stairs are fine.  You may begin to exercise again as you feel able.  Do not lift any weights for two weeks.  Do not put anything in the vagina until your post op appointment.  Use a sitz bath or something to irrigate the incision with warm water twice a day until there is no drainage or tenderness.    REGULAR MEDIATIONS/VITAMINS:  You may restart all of your regular medications as prescribed.  You may restart all of your vitamins as you normally take them.    PLEASE CALL OR SEEK MEDICAL CARE IF:  You have persistent  nausea and vomiting.   You have trouble eating or drinking.   You have an oral temperature above 100.5.   You have constipation that is not helped by adjusting diet or increasing fluid intake. Pain medicines are a common cause of constipation.   You have heavier bleeding from the incision site.  You will have some bright red bleeding today and may tomorrow but it should not be heavy.  It should lessen and stop unless you have a very active day.  Then there could be a little bleeding again.

## 2020-05-08 NOTE — Transfer of Care (Signed)
Immediate Anesthesia Transfer of Care Note  Patient: Monica Bridges  Procedure(s) Performed: BARTHOLIN CYST MARSUPIALIZATION (N/A )  Patient Location: PACU  Anesthesia Type:General  Level of Consciousness: awake, alert  and oriented  Airway & Oxygen Therapy: Patient Spontanous Breathing and Patient connected to face mask oxygen  Post-op Assessment: Report given to RN and Post -op Vital signs reviewed and stable  Post vital signs: Reviewed and stable  Last Vitals:  Vitals Value Taken Time  BP 115/75 05/08/20 0823  Temp    Pulse 50 05/08/20 0825  Resp 8 05/08/20 0825  SpO2 100 % 05/08/20 0825  Vitals shown include unvalidated device data.  Last Pain:  Vitals:   05/08/20 0643  TempSrc: Oral         Complications: No complications documented.

## 2020-05-08 NOTE — Anesthesia Preprocedure Evaluation (Addendum)
Anesthesia Evaluation  Patient identified by MRN, date of birth, ID band Patient awake    Reviewed: Allergy & Precautions, H&P , NPO status , Patient's Chart, lab work & pertinent test results  Airway Mallampati: II  TM Distance: >3 FB Neck ROM: Full    Dental no notable dental hx. (+) Teeth Intact, Dental Advisory Given   Pulmonary sleep apnea , Current Smoker and Patient abstained from smoking.,    Pulmonary exam normal breath sounds clear to auscultation       Cardiovascular negative cardio ROS   Rhythm:Regular Rate:Normal     Neuro/Psych Depression negative neurological ROS     GI/Hepatic negative GI ROS, Neg liver ROS,   Endo/Other  negative endocrine ROS  Renal/GU negative Renal ROS  negative genitourinary   Musculoskeletal   Abdominal   Peds  Hematology negative hematology ROS (+)   Anesthesia Other Findings   Reproductive/Obstetrics negative OB ROS                            Anesthesia Physical Anesthesia Plan  ASA: II  Anesthesia Plan: General   Post-op Pain Management:    Induction: Intravenous  PONV Risk Score and Plan: 3 and Ondansetron, Dexamethasone and Midazolam  Airway Management Planned: LMA  Additional Equipment:   Intra-op Plan:   Post-operative Plan: Extubation in OR  Informed Consent: I have reviewed the patients History and Physical, chart, labs and discussed the procedure including the risks, benefits and alternatives for the proposed anesthesia with the patient or authorized representative who has indicated his/her understanding and acceptance.     Dental advisory given  Plan Discussed with: CRNA  Anesthesia Plan Comments:         Anesthesia Quick Evaluation

## 2020-05-08 NOTE — Anesthesia Procedure Notes (Signed)
Procedure Name: Intubation Date/Time: 05/08/2020 7:44 AM Performed by: Macie Burows, CRNA Pre-anesthesia Checklist: Patient identified, Emergency Drugs available, Suction available and Patient being monitored Patient Re-evaluated:Patient Re-evaluated prior to induction Oxygen Delivery Method: Circle system utilized Preoxygenation: Pre-oxygenation with 100% oxygen Induction Type: IV induction Ventilation: Mask ventilation without difficulty LMA: LMA inserted LMA Size: 4.0 Number of attempts: 1 Airway Equipment and Method: Oral airway Placement Confirmation: positive ETCO2 and breath sounds checked- equal and bilateral Tube secured with: Tape Dental Injury: Teeth and Oropharynx as per pre-operative assessment

## 2020-05-09 ENCOUNTER — Encounter (HOSPITAL_COMMUNITY): Payer: Self-pay | Admitting: Obstetrics & Gynecology

## 2020-05-10 ENCOUNTER — Telehealth (HOSPITAL_BASED_OUTPATIENT_CLINIC_OR_DEPARTMENT_OTHER): Payer: Self-pay | Admitting: Obstetrics & Gynecology

## 2020-05-10 NOTE — Telephone Encounter (Signed)
Opened in error

## 2020-05-14 LAB — AEROBIC/ANAEROBIC CULTURE W GRAM STAIN (SURGICAL/DEEP WOUND)

## 2020-05-19 ENCOUNTER — Telehealth (HOSPITAL_BASED_OUTPATIENT_CLINIC_OR_DEPARTMENT_OTHER): Payer: Self-pay | Admitting: Obstetrics & Gynecology

## 2020-05-19 NOTE — Telephone Encounter (Signed)
Called patient and left message to please  call the office to scheduled a  post op appointment with Dr.Miller.

## 2020-05-26 ENCOUNTER — Other Ambulatory Visit: Payer: Self-pay

## 2020-05-26 ENCOUNTER — Encounter (INDEPENDENT_AMBULATORY_CARE_PROVIDER_SITE_OTHER): Payer: Self-pay

## 2020-05-26 ENCOUNTER — Ambulatory Visit (INDEPENDENT_AMBULATORY_CARE_PROVIDER_SITE_OTHER): Payer: BC Managed Care – PPO | Admitting: Obstetrics & Gynecology

## 2020-05-26 ENCOUNTER — Encounter (HOSPITAL_BASED_OUTPATIENT_CLINIC_OR_DEPARTMENT_OTHER): Payer: Self-pay

## 2020-05-26 DIAGNOSIS — N751 Abscess of Bartholin's gland: Secondary | ICD-10-CM

## 2020-05-26 NOTE — Progress Notes (Signed)
Post Operative Visit  Procedure: marsupialization of left bartholin's abscess Procedure date:  3/12/20222  Subjective: Pt reports this area has been more tender than she thought it would be.  Culture of wound grew e coli.  She was treated with Keflex x 7 days.  E coli was sensitive to keflex.  Denies fever, drainage.  Area is still tender today but sutures likely need to be removed.  Objective: There were no vitals taken for this visit.  EXAM General: alert, cooperative and no distress Gyn:  NAEFG, area of bartholin's marsupialization is without mass or lesion, it is tender right where sutures are present, sutures were removed today and pt tolerated this well. Vaginal Bleeding: minimal and pt is on cycle, not using a tampon yet  Assessment: s/p marsupialization of left Bartholin's gland abscess  Plan: Return for AEX as is already scheduled.  Call for any new concerns.

## 2020-05-28 ENCOUNTER — Encounter (HOSPITAL_BASED_OUTPATIENT_CLINIC_OR_DEPARTMENT_OTHER): Payer: Self-pay | Admitting: Obstetrics & Gynecology

## 2020-05-28 DIAGNOSIS — N751 Abscess of Bartholin's gland: Secondary | ICD-10-CM | POA: Insufficient documentation

## 2020-07-09 ENCOUNTER — Other Ambulatory Visit: Payer: Self-pay

## 2020-07-09 ENCOUNTER — Ambulatory Visit (INDEPENDENT_AMBULATORY_CARE_PROVIDER_SITE_OTHER): Payer: BC Managed Care – PPO | Admitting: Obstetrics & Gynecology

## 2020-07-09 ENCOUNTER — Encounter (HOSPITAL_BASED_OUTPATIENT_CLINIC_OR_DEPARTMENT_OTHER): Payer: Self-pay | Admitting: Obstetrics & Gynecology

## 2020-07-09 ENCOUNTER — Encounter (HOSPITAL_BASED_OUTPATIENT_CLINIC_OR_DEPARTMENT_OTHER): Payer: Self-pay

## 2020-07-09 ENCOUNTER — Other Ambulatory Visit (HOSPITAL_COMMUNITY)
Admission: RE | Admit: 2020-07-09 | Discharge: 2020-07-09 | Disposition: A | Discharge status: Home / Self Care | Payer: BC Managed Care – PPO | Source: Ambulatory Visit | Attending: Obstetrics & Gynecology | Admitting: Obstetrics & Gynecology

## 2020-07-09 VITALS — BP 111/54 | HR 75 | Ht 70.5 in | Wt 182.0 lb

## 2020-07-09 DIAGNOSIS — N926 Irregular menstruation, unspecified: Secondary | ICD-10-CM | POA: Diagnosis not present

## 2020-07-09 DIAGNOSIS — Z124 Encounter for screening for malignant neoplasm of cervix: Secondary | ICD-10-CM | POA: Insufficient documentation

## 2020-07-09 DIAGNOSIS — Z30017 Encounter for initial prescription of implantable subdermal contraceptive: Secondary | ICD-10-CM

## 2020-07-09 DIAGNOSIS — Z01419 Encounter for gynecological examination (general) (routine) without abnormal findings: Secondary | ICD-10-CM

## 2020-07-09 MED ORDER — NORETHIN ACE-ETH ESTRAD-FE 1-20 MG-MCG PO TABS: 1.0000 | ORAL_TABLET | Freq: Every day | ORAL | 3 refills | Status: DC

## 2020-07-09 NOTE — Progress Notes (Signed)
30 y.o. G0P0000 Single White or Caucasian female here for annual exam.  Cycles are irregular and unpredictable with the Nexplanon.  Last one was almost two weeks.  Has used OCPs in 2021 to see if would regulate cycles.  It did.  She is a bit nervous about using pills only for contraception but did so much better with the OCPs, will restart and then decide if wants Naxplanon out.  Is pretty sure she doesn't want children.  Significant other and she have discussed him having a vasectomy so she could just stop all hormonal methods.  Is scared about how bad her cycles could be as they were really painful before she went on any form of contraception.  We discussed considering hysterectomy at that point--if/when fully decided about future child bearing.  She has no decisions right now but comfortable with OCPs.  No LMP recorded. Patient has had an implant.          Sexually active: Yes.    The current method of family planning is Nexplanon.    Exercising: Yes.    cardio, walking, jogging Smoker:  yes  Health Maintenance: Pap:  Pap obtained today History of abnormal Pap:  Yes, 2016 with CIN 1 MMG:  Guidelines reviewed Colonoscopy:  Guidelines reviewed TDaP:  2015   reports that she has been smoking cigarettes. She has been smoking about 0.25 packs per day. She has never used smokeless tobacco. She reports current alcohol use. She reports current drug use. Drug: Marijuana.  Past Medical History:  Diagnosis Date  . 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 Depression   . 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 OSA (obstructive sleep apnea)   . STD (sexually transmitted disease) 09/08/2015   Chlamydia     Past Surgical History:  Procedure Laterality Date  . BARTHOLIN CYST MARSUPIALIZATION N/A 05/08/2020   Procedure: BARTHOLIN CYST MARSUPIALIZATION;  Surgeon: Jerene Bears, MD;  Location: [facility] OR;  Service: Gynecology;  Laterality: N/A;  .  COLPOSCOPY  7/12   CIN I  . DIAGNOSTIC LAPAROSCOPY WITH REMOVAL OF ECTOPIC PREGNANCY N/A 10/20/2018   Procedure: DIAGNOSTIC LAPAROSCOPY, LEFT SALPINGECTOMY  WITH REMOVAL OF RUPTURED ECTOPIC PREGNANCY, REMOVAL IUD;  Surgeon: Jerene Bears, MD;  Location: [facility] OR;  Service: Gynecology;  Laterality: N/A;  . WISDOM TOOTH EXTRACTION      Current Outpatient Medications  Medication Sig Dispense Refill  . etonogestrel (NEXPLANON) 68 MG IMPL implant     . ibuprofen (ADVIL) 800 MG tablet Take 1 tablet (800 mg total) by mouth every 8 (eight) hours as needed. 30 tablet 0   No current facility-administered medications for this visit.    Family History  Problem Relation Age of Onset  . Cancer Maternal Aunt        Lung    Review of Systems  Genitourinary: Positive for menstrual problem.  All other systems reviewed and are negative.   Exam:   BP (!) 111/54 (BP Location: Right Arm, Patient Position: Sitting, Cuff Size: Small)   Pulse 75   Ht 5' 10.5" (1.791 m)   Wt 182 lb (82.6 kg)   BMI 25.75 kg/m   Height: 5' 10.5" (179.1 cm)  General appearance: alert, cooperative and appears stated age Head: Normocephalic, without obvious abnormality, atraumatic Neck: no adenopathy, supple, symmetrical, trachea midline and thyroid normal to inspection and palpation Lungs: clear to auscultation bilaterally Breasts: normal appearance, no masses or tenderness  Heart: regular rate and rhythm Abdomen: soft, non-tender; bowel sounds normal; no masses,  no organomegaly Extremities: extremities normal, atraumatic, no cyanosis or edema Skin: Skin color, texture, turgor normal. No rashes or lesions Lymph nodes: Cervical, supraclavicular, and axillary nodes normal. No abnormal inguinal nodes palpated Neurologic: Grossly normal   Pelvic: External genitalia:  no lesions              Urethra:  normal appearing urethra with no masses, tenderness or lesions              Bartholins and Skenes: normal                  Vagina: normal appearing vagina with normal color and no discharge, no lesions              Cervix: no lesions              Pap taken: Yes.   Bimanual Exam:  Uterus:  normal size, contour, position, consistency, mobility, non-tender              Adnexa: normal adnexa and no mass, fullness, tenderness               Rectovaginal: Confirms               Anus:  normal sphincter tone, no lesions  Chaperone, Ina Homes, CMA, was present for exam.  Assessment/Plan: 1. Well woman exam with routine gynecological exam - pap with HR HPV obtained today - breast and colon cancer screening guidelines reviewed  2. Nexplanon insertion  3. Irregular bleeding - norethindrone-ethinyl estradiol-FE (LOESTRIN FE) 1-20 MG-MCG tablet; Take 1 tablet by mouth daily.  Dispense: 84 tablet; Refill: 3  4.  H/o LGSIL pap 05/2010  5.  H/o chlamydia in 2017  6.  H/o HSV 1

## 2020-07-15 LAB — CYTOLOGY - PAP
Comment: NEGATIVE
Diagnosis: NEGATIVE
High risk HPV: NEGATIVE

## 2020-08-13 DIAGNOSIS — H6983 Other specified disorders of Eustachian tube, bilateral: Secondary | ICD-10-CM | POA: Diagnosis not present

## 2020-08-13 DIAGNOSIS — J329 Chronic sinusitis, unspecified: Secondary | ICD-10-CM | POA: Diagnosis not present

## 2020-08-13 DIAGNOSIS — J029 Acute pharyngitis, unspecified: Secondary | ICD-10-CM | POA: Diagnosis not present

## 2020-08-21 ENCOUNTER — Encounter (HOSPITAL_BASED_OUTPATIENT_CLINIC_OR_DEPARTMENT_OTHER): Payer: Self-pay

## 2020-08-23 ENCOUNTER — Other Ambulatory Visit (HOSPITAL_BASED_OUTPATIENT_CLINIC_OR_DEPARTMENT_OTHER): Payer: Self-pay | Admitting: Obstetrics & Gynecology

## 2020-08-23 MED ORDER — FLUCONAZOLE 150 MG PO TABS: 150.0000 mg | ORAL_TABLET | Freq: Once | ORAL | 0 refills | Status: AC

## 2020-11-12 IMAGING — US OBSTETRIC <14 WK US AND TRANSVAGINAL OB US
1 series · 14 of 28 positions shown · non-contrast
Comparison: None.

CLINICAL DATA: Patient with pelvic pain.

EXAM:
OBSTETRIC <14 WK US AND TRANSVAGINAL OB US
TECHNIQUE: Both transabdominal and transvaginal ultrasound examinations were
performed for complete evaluation of the gestation as well as the
maternal uterus, adnexal regions, and pelvic cul-de-sac.
Transvaginal technique was performed to assess early pregnancy.

[Series 2: obstetric <14 wk us and transvaginal ob us · 40 acquisitions, 14 frames shown]
[im 2/40]
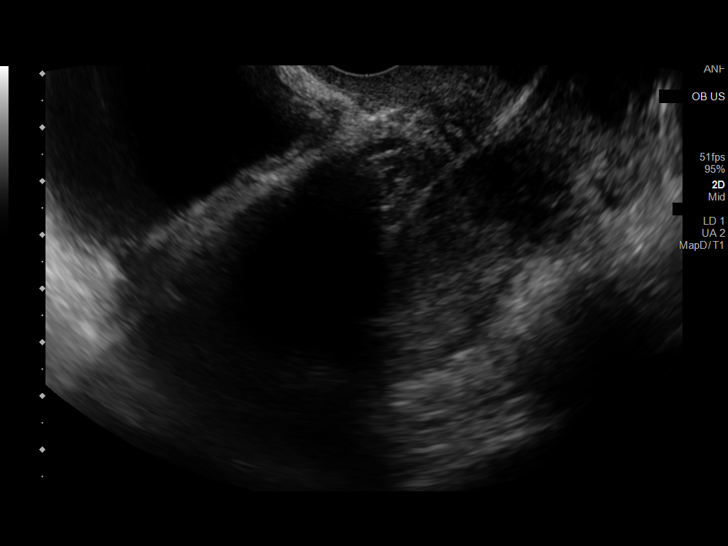
[im 5/40]
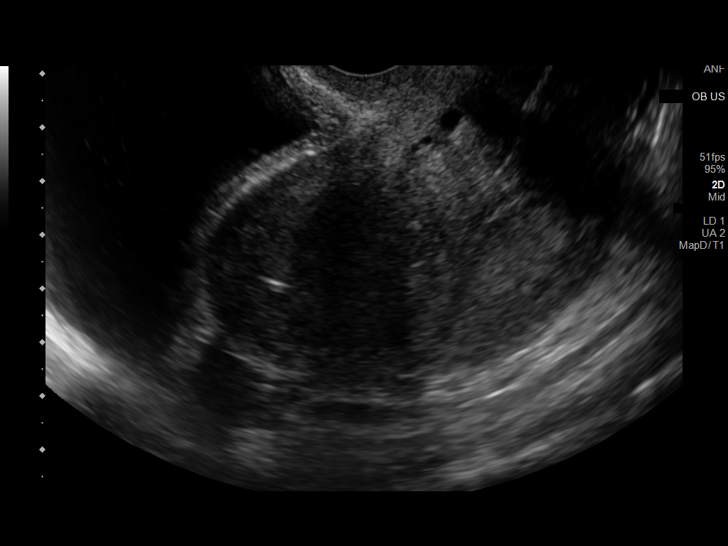
[im 8/40]
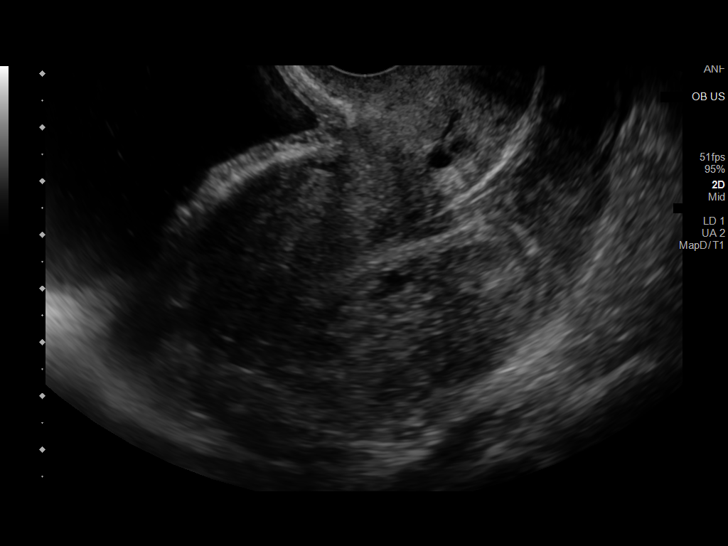
[im 11/40]
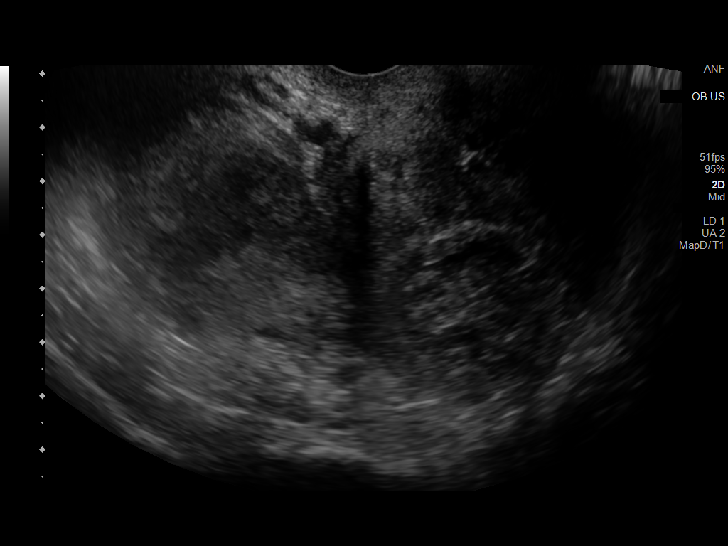
[im 14/40]
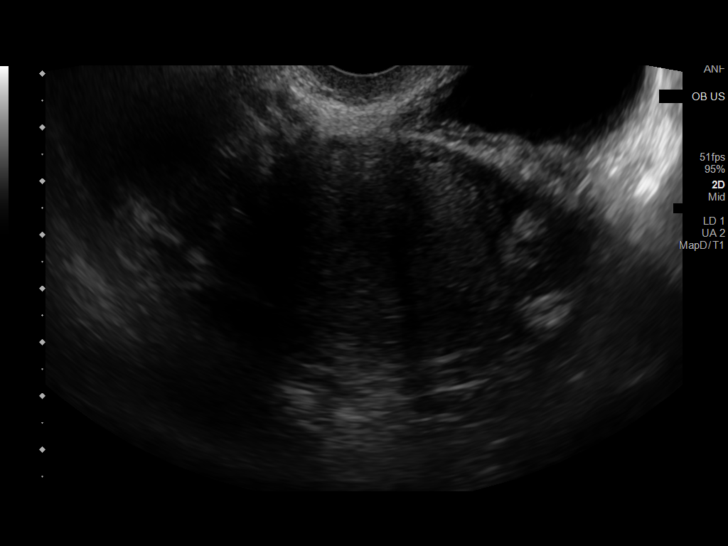
[im 16/40]
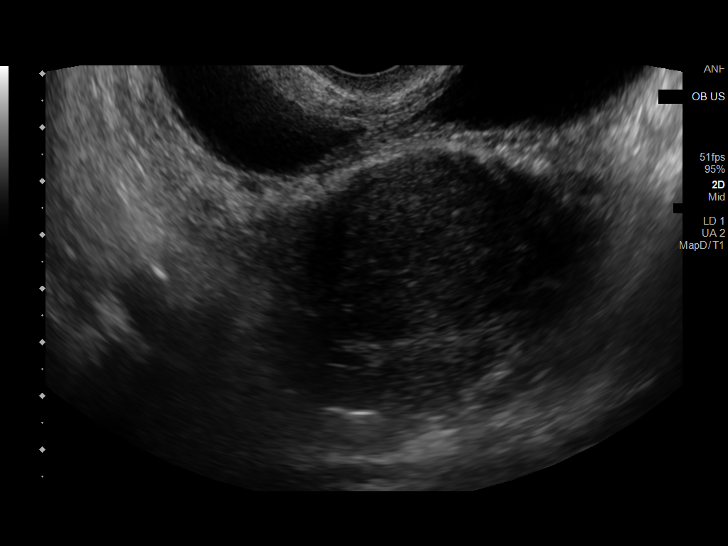
[im 19/40]
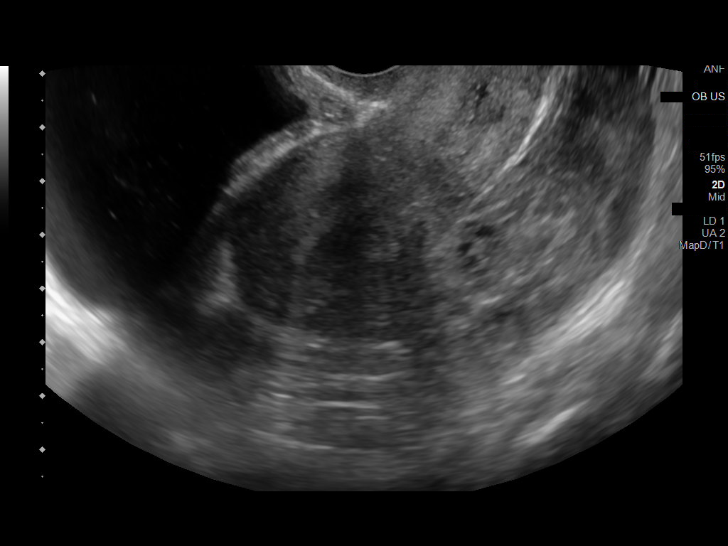
[im 22/40]
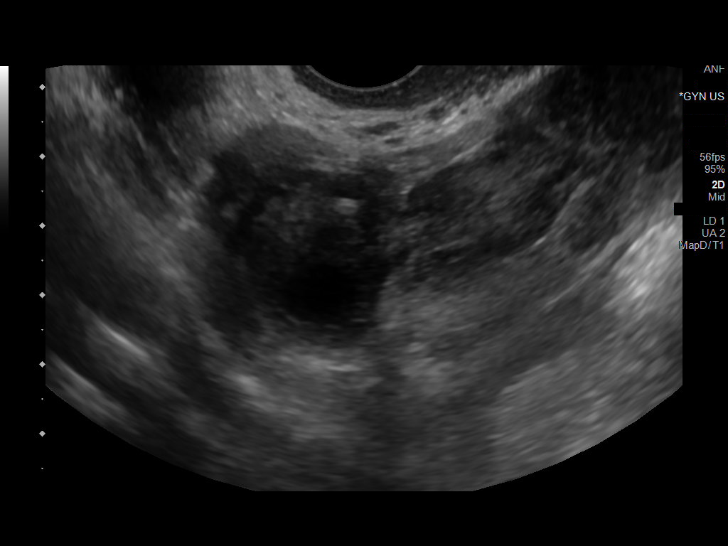
[im 25/40]
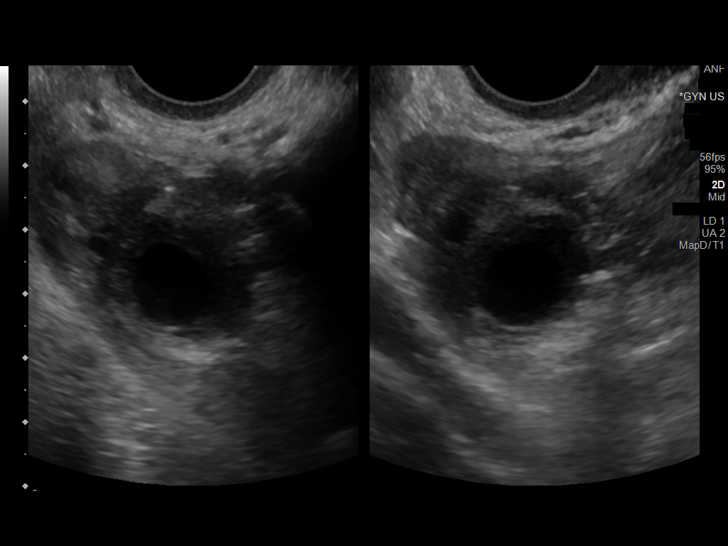
[im 28/40]
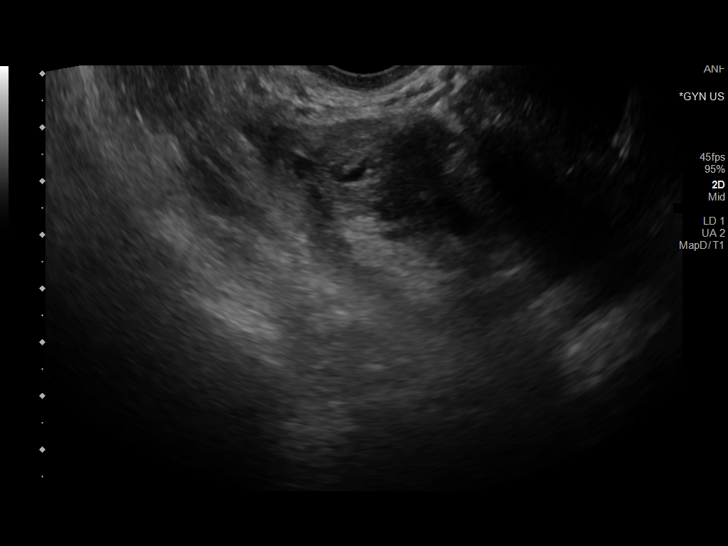
[im 31/40]
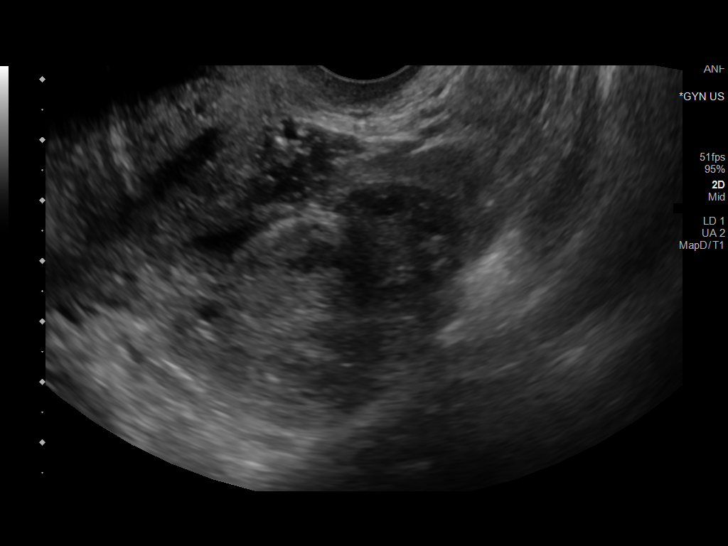
[im 34/40]
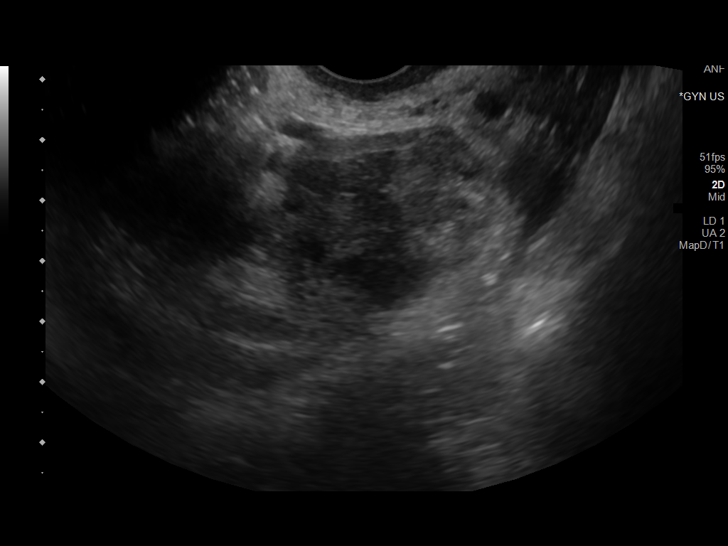
[im 37/40]
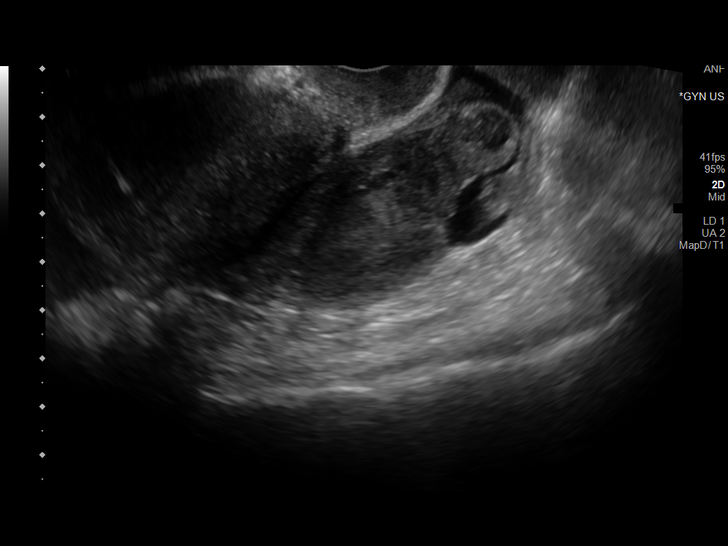
[im 40/40]
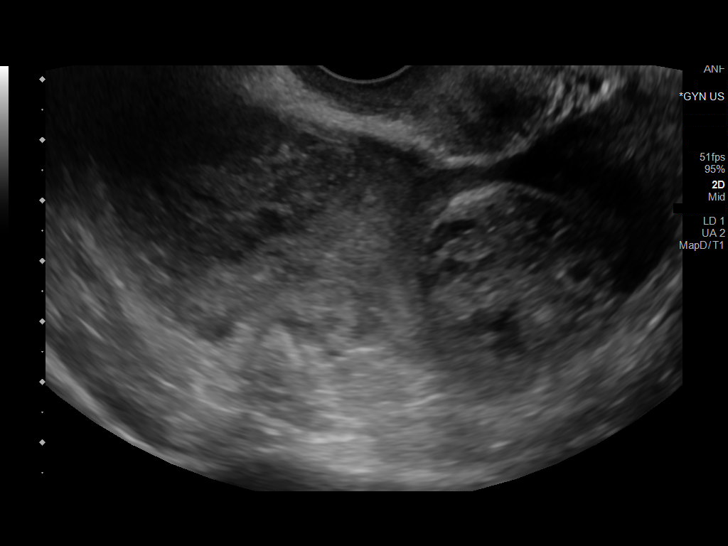

[14 of 28 positions shown; findings below may reference images not displayed]

FINDINGS: Intrauterine gestational sac: None

Yolk sac:  Not Visualized.

Embryo:  Not Visualized.

Cardiac Activity: Not Visualized.

Maternal uterus/adnexae: There is a complex mass posterior to the
uterus which extends from the right to left adnexa. This is
difficult to measure given the amorphous nature. Intrauterine device
is present within the endometrial canal. Small cyst within the right
ovary.
IMPRESSION: Complex heterogeneous mass posterior to the uterus concerning for
the ectopic pregnancy with associated hemorrhage/blood products.

Critical Value/emergent results were called by telephone at the time
of interpretation on 10/20/2018 at [DATE] to Dr. Aboytes, who verbally
acknowledged these results.

## 2021-06-07 ENCOUNTER — Other Ambulatory Visit (HOSPITAL_BASED_OUTPATIENT_CLINIC_OR_DEPARTMENT_OTHER): Payer: Self-pay | Admitting: *Deleted

## 2021-06-07 DIAGNOSIS — N926 Irregular menstruation, unspecified: Secondary | ICD-10-CM

## 2021-06-07 MED ORDER — NORETHIN ACE-ETH ESTRAD-FE 1-20 MG-MCG PO TABS: 1.0000 | ORAL_TABLET | Freq: Every day | ORAL | 0 refills | Status: DC

## 2021-07-21 ENCOUNTER — Other Ambulatory Visit: Payer: Self-pay

## 2021-07-21 ENCOUNTER — Encounter (HOSPITAL_BASED_OUTPATIENT_CLINIC_OR_DEPARTMENT_OTHER): Payer: Self-pay | Admitting: Obstetrics & Gynecology

## 2021-07-21 ENCOUNTER — Ambulatory Visit (INDEPENDENT_AMBULATORY_CARE_PROVIDER_SITE_OTHER): Payer: BC Managed Care – PPO | Admitting: Obstetrics & Gynecology

## 2021-07-21 VITALS — BP 124/67 | HR 76 | Ht 70.5 in | Wt 189.0 lb

## 2021-07-21 DIAGNOSIS — Z01419 Encounter for gynecological examination (general) (routine) without abnormal findings: Secondary | ICD-10-CM

## 2021-07-21 DIAGNOSIS — Z8742 Personal history of other diseases of the female genital tract: Secondary | ICD-10-CM

## 2021-07-21 DIAGNOSIS — Z87410 Personal history of cervical dysplasia: Secondary | ICD-10-CM | POA: Diagnosis not present

## 2021-07-21 DIAGNOSIS — N926 Irregular menstruation, unspecified: Secondary | ICD-10-CM | POA: Diagnosis not present

## 2021-07-21 DIAGNOSIS — N751 Abscess of Bartholin's gland: Secondary | ICD-10-CM

## 2021-07-21 MED ORDER — NORETHIN ACE-ETH ESTRAD-FE 1-20 MG-MCG PO TABS
1.0000 | ORAL_TABLET | Freq: Every day | ORAL | 3 refills | Status: DC
  Filled 2021-07-21 – 2021-08-11 (×3): qty 84, 84d supply, fill #0
  Filled 2021-11-01 – 2021-11-07 (×2): qty 84, 84d supply, fill #1

## 2021-07-21 NOTE — Progress Notes (Signed)
31 y.o. G0P0000 Single White or Caucasian female here for annual exam.  Doing well.  Going to [place] with significant other.  Will be in [place].  Has Nexplanon.  Has irregular bleeding with this.  Is bothered by this.  Has long hx of dysmenorrhea when not on any hormonal method.  Had ectopic with [facility] IUD.  Has been scared to use this since then.  Having enough irregular bleeding with Nexplanon that she started OCPs.  Really does not both of these methods.  Continue to consider definitive treatment for dysmenorrhea so she can be off hormonal therapy completely.    Having a monthly cycle with the OCPs.    Patient's last menstrual period was 07/05/2021.          Sexually active: Yes.    The current method of family planning is Nexplanon.     Upstream - 07/21/21 1559       Pregnancy Intention Screening   Does the patient want to become pregnant in the next year? No    Does the patient's partner want to become pregnant in the next year? No    Would the patient like to discuss contraceptive options today? Yes      Contraception Wrap Up   Current Method Hormonal Implant            The pregnancy intention screening data noted above was reviewed. Potential methods of contraception were discussed. The patient elected to proceed with No data recorded.  Exercising: Yes.     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 She has been smoking an average of .25 packs per day. She has never used smokeless tobacco. She reports current alcohol use. She reports current drug use. Drug: Marijuana.  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   LSIL (low grade squamous intraepithelial lesion) on Pap  smear 4/12   OSA (obstructive sleep apnea)    STD (sexually transmitted disease) 09/08/2015   Chlamydia     Past Surgical History:  Procedure Laterality Date   BARTHOLIN CYST MARSUPIALIZATION N/A 05/08/2020   Procedure: BARTHOLIN CYST MARSUPIALIZATION;  Surgeon: Jerene Bears, MD;  Location: [facility] OR;  Service: Gynecology;  Laterality: N/A;   COLPOSCOPY  7/12   CIN I   DIAGNOSTIC LAPAROSCOPY WITH REMOVAL OF ECTOPIC PREGNANCY N/A 10/20/2018   Procedure: DIAGNOSTIC LAPAROSCOPY, LEFT SALPINGECTOMY  WITH REMOVAL OF RUPTURED ECTOPIC PREGNANCY, REMOVAL IUD;  Surgeon: Jerene Bears, MD;  Location: [facility] OR;  Service: Gynecology;  Laterality: N/A;   WISDOM TOOTH EXTRACTION      Current Outpatient Medications  Medication Sig Dispense Refill   etonogestrel (NEXPLANON) 68 MG IMPL implant      norethindrone-ethinyl estradiol-FE (LOESTRIN FE) 1-20 MG-MCG tablet Take 1 tablet by mouth daily. 84 tablet 0   No current facility-administered medications for this visit.    Family History  Problem Relation Age of Onset   Cancer Maternal Aunt        Lung   ROS Genitourinary:negative  Exam:   BP 124/67   Pulse 76   Ht 5' 10.5" (1.791 m)   Wt 189 lb (85.7 kg)   LMP 07/05/2021  BMI 26.74 kg/m   Height: 5' 10.5" (179.1 cm)  General appearance: alert, cooperative and appears stated age Head: Normocephalic, without obvious abnormality, atraumatic Neck: no adenopathy, supple, symmetrical, trachea midline and thyroid normal to inspection and palpation Lungs: clear to auscultation bilaterally Breasts: normal appearance, no masses or tenderness Heart: regular rate and rhythm Abdomen: soft, non-tender; bowel sounds normal; no masses,  no organomegaly Extremities: extremities normal, atraumatic, no cyanosis or edema Skin: Skin color, texture, turgor normal. No rashes or lesions Lymph nodes: Cervical, supraclavicular, and axillary nodes normal. No abnormal inguinal nodes palpated Neurologic: Grossly  normal   Pelvic: External genitalia:  no lesions              Urethra:  normal appearing urethra with no masses, tenderness or lesions              Bartholins and Skenes: normal                 Vagina: normal appearing vagina with normal color and no discharge, no lesions              Cervix: no lesions              Pap taken: No. Bimanual Exam:  Uterus:  normal size, contour, position, consistency, mobility, non-tender              Adnexa: normal adnexa and no mass, fullness, tenderness               Rectovaginal: Confirms               Anus:  normal sphincter tone, no lesions  Chaperone, Raechel Ache, RN, was present for exam.  Assessment/Plan: 1. Well woman exam with routine gynecological exam - pap neg with neg HR HPV 5/22 - breast and colon cancer screening guidelines reviewed - vaccines reviewed/up to date  2. Irregular bleeding - Nexplanon not due out until the fall.  Will keep it until then. - norethindrone-ethinyl estradiol-FE (LOESTRIN FE) 1-20 MG-MCG tablet; Take 1 tablet by mouth daily.  Dispense: 84 tablet; Refill: 3  3. History of cervical dysplasia  4. H/o bartholin's gland marsupialization  - doing well from this standpoint  5.  Dysmenorrhea - although this is under good control right now, she is planning on having the Nexplanon removed when due out and doesn't really want to continue on dual methods.  Also, with hx of ectopic pregnancy while using an IUD, she is very nervous about OCPs alone as form of contraception.  Has discussed hysterectomy for treatment if dysmenorrhea returns.  Contemplating this.

## 2021-07-23 DIAGNOSIS — Z8742 Personal history of other diseases of the female genital tract: Secondary | ICD-10-CM | POA: Insufficient documentation

## 2021-08-09 ENCOUNTER — Other Ambulatory Visit: Payer: Self-pay

## 2021-08-11 ENCOUNTER — Other Ambulatory Visit: Payer: Self-pay

## 2021-08-22 ENCOUNTER — Encounter (HOSPITAL_BASED_OUTPATIENT_CLINIC_OR_DEPARTMENT_OTHER): Payer: Self-pay | Admitting: *Deleted

## 2021-09-02 ENCOUNTER — Other Ambulatory Visit (HOSPITAL_BASED_OUTPATIENT_CLINIC_OR_DEPARTMENT_OTHER): Payer: Self-pay | Admitting: Obstetrics & Gynecology

## 2021-09-02 DIAGNOSIS — N926 Irregular menstruation, unspecified: Secondary | ICD-10-CM

## 2021-11-01 ENCOUNTER — Other Ambulatory Visit: Payer: Self-pay

## 2021-11-07 ENCOUNTER — Other Ambulatory Visit (HOSPITAL_COMMUNITY): Payer: Self-pay

## 2021-11-15 ENCOUNTER — Encounter (HOSPITAL_BASED_OUTPATIENT_CLINIC_OR_DEPARTMENT_OTHER): Payer: Self-pay | Admitting: Advanced Practice Midwife

## 2021-11-15 ENCOUNTER — Ambulatory Visit (INDEPENDENT_AMBULATORY_CARE_PROVIDER_SITE_OTHER): Payer: BC Managed Care – PPO | Admitting: Advanced Practice Midwife

## 2021-11-15 VITALS — BP 124/78 | HR 62 | Ht 71.0 in | Wt 199.8 lb

## 2021-11-15 DIAGNOSIS — Z30015 Encounter for initial prescription of vaginal ring hormonal contraceptive: Secondary | ICD-10-CM | POA: Diagnosis not present

## 2021-11-15 DIAGNOSIS — F17200 Nicotine dependence, unspecified, uncomplicated: Secondary | ICD-10-CM | POA: Diagnosis not present

## 2021-11-15 DIAGNOSIS — Z3046 Encounter for surveillance of implantable subdermal contraceptive: Secondary | ICD-10-CM | POA: Diagnosis not present

## 2021-11-15 DIAGNOSIS — Z30018 Encounter for initial prescription of other contraceptives: Secondary | ICD-10-CM | POA: Diagnosis not present

## 2021-11-15 DIAGNOSIS — Z3009 Encounter for other general counseling and advice on contraception: Secondary | ICD-10-CM | POA: Diagnosis not present

## 2021-11-15 MED ORDER — ETONOGESTREL-ETHINYL ESTRADIOL 0.12-0.015 MG/24HR VA RING: VAGINAL_RING | VAGINAL | 12 refills | Status: DC

## 2021-11-15 NOTE — Progress Notes (Addendum)
   GYNECOLOGY PROGRESS NOTE  History:  31 y.o. G0P0000 presents to [place] office today for contraceptive visit. She reports breakthrough bleeding the entire 3 years of her Nexplanon, resolved with OCPs, which she is currently taking.  She desires removal of Nexplanon and wants to discuss other contraceptive options. She denies h/a, dizziness, shortness of breath, n/v, or fever/chills.    The following portions of the patient's history were reviewed and updated as appropriate: allergies, current medications, past family history, past medical history, past social history, past surgical history and problem list. Last pap smear on 07/09/20 was normal, negative HRHPV.  Health Maintenance Due  Topic Date Due   HPV VACCINES (2 - 3-dose series) 03/27/2005   COVID-19 Vaccine (4 - Pfizer series) 03/28/2020   INFLUENZA VACCINE  09/27/2021     Review of Systems:  Pertinent items are noted in HPI.   Objective:  Physical Exam Blood pressure 124/78, pulse 62, height 5\' 11"  (1.803 m), weight 199 lb 12.8 oz (90.6 kg), last menstrual period 11/01/2021. VS reviewed, nursing note reviewed,  Constitutional: well developed, well nourished, no distress HEENT: normocephalic CV: normal rate Pulm/chest wall: normal effort Breast Exam: deferred Abdomen: soft Neuro: alert and oriented x 3 Skin: warm, dry Psych: affect normal Pelvic exam: Cervix pink, visually closed, without lesion, scant white creamy discharge, vaginal walls and external genitalia normal Bimanual exam: Cervix 0/long/high, firm, anterior, neg CMT, uterus nontender, nonenlarged, adnexa without tenderness, enlargement, or mass   Nexplanon Removal Patient identified, informed consent performed, consent signed.   Appropriate time out taken. Nexplanon site identified.  Area prepped in usual sterile fashon. One ml of 1% lidocaine was used to anesthetize the area at the distal end of the implant. A small stab incision was made right beside  the implant on the distal portion.  The Nexplanon rod was grasped using hemostats and removed without difficulty.  There was minimal blood loss. There were no complications.  3 ml of 1% lidocaine was injected around the incision for post-procedure analgesia.  Steri-strips were applied over the small incision.  A pressure bandage was applied to reduce any bruising.  The patient tolerated the procedure well and was given post procedure instructions.  Patient is planning to use Nuvaring for contraception.  Assessment & Plan:  1. Ready to quit smoking --Information given about smoking cessation program through [facility] and websites for more information.  2. Encounter for counseling regarding contraception --Discussed pt contraceptive plans and reviewed contraceptive methods based on pt preferences and effectiveness.    --Pt has failed OCPs with difficulty working night shift and taking pills at same time daily, she had breakthrough bleeding with Nexplanon, and had ectopic pregnancy with an IUD. Pt prefers to begin Nuvaring today.  - etonogestrel-ethinyl estradiol (NUVARING) 0.12-0.015 MG/24HR vaginal ring; Insert vaginally and leave in place for 3 consecutive weeks, then remove for 1 week.  Dispense: 1 each; Refill: 12    4. Encounter for Nexplanon removal    Return in about 3 months (around 02/14/2022) for Gyn follow up for contraception.   Fatima Blank, CNM 2:23 PM

## 2021-11-17 DIAGNOSIS — Z30018 Encounter for initial prescription of other contraceptives: Secondary | ICD-10-CM | POA: Insufficient documentation

## 2021-12-01 ENCOUNTER — Other Ambulatory Visit: Payer: Self-pay

## 2021-12-01 ENCOUNTER — Other Ambulatory Visit (HOSPITAL_BASED_OUTPATIENT_CLINIC_OR_DEPARTMENT_OTHER): Payer: Self-pay

## 2021-12-01 DIAGNOSIS — Z3009 Encounter for other general counseling and advice on contraception: Secondary | ICD-10-CM

## 2021-12-01 MED ORDER — ETONOGESTREL-ETHINYL ESTRADIOL 0.12-0.015 MG/24HR VA RING
VAGINAL_RING | VAGINAL | 12 refills | Status: DC
  Filled 2021-12-01: qty 1, 28d supply, fill #0

## 2021-12-02 ENCOUNTER — Other Ambulatory Visit: Payer: Self-pay

## 2021-12-06 ENCOUNTER — Other Ambulatory Visit (HOSPITAL_BASED_OUTPATIENT_CLINIC_OR_DEPARTMENT_OTHER): Payer: Self-pay | Admitting: Obstetrics & Gynecology

## 2021-12-06 ENCOUNTER — Encounter (HOSPITAL_BASED_OUTPATIENT_CLINIC_OR_DEPARTMENT_OTHER): Payer: Self-pay | Admitting: Obstetrics & Gynecology

## 2021-12-06 DIAGNOSIS — Z3009 Encounter for other general counseling and advice on contraception: Secondary | ICD-10-CM

## 2021-12-06 MED ORDER — ETONOGESTREL-ETHINYL ESTRADIOL 0.12-0.015 MG/24HR VA RING
VAGINAL_RING | VAGINAL | 4 refills | Status: DC
Start: 2021-12-06 — End: 2021-12-08

## 2021-12-08 ENCOUNTER — Other Ambulatory Visit (HOSPITAL_BASED_OUTPATIENT_CLINIC_OR_DEPARTMENT_OTHER): Payer: Self-pay | Admitting: *Deleted

## 2021-12-08 ENCOUNTER — Other Ambulatory Visit: Payer: Self-pay

## 2021-12-08 DIAGNOSIS — Z3009 Encounter for other general counseling and advice on contraception: Secondary | ICD-10-CM

## 2021-12-08 MED ORDER — ETONOGESTREL-ETHINYL ESTRADIOL 0.12-0.015 MG/24HR VA RING
VAGINAL_RING | VAGINAL | 4 refills | Status: DC
  Filled 2021-12-08: qty 3, fill #0
  Filled 2021-12-25: qty 3, 84d supply, fill #0
  Filled 2022-02-09 – 2022-03-02 (×2): qty 3, 84d supply, fill #1
  Filled 2022-05-16 – 2022-05-25 (×2): qty 3, 84d supply, fill #2
  Filled 2022-08-11: qty 3, 84d supply, fill #3
  Filled 2022-11-06: qty 3, 84d supply, fill #4

## 2021-12-26 ENCOUNTER — Other Ambulatory Visit (HOSPITAL_COMMUNITY): Payer: Self-pay

## 2021-12-27 ENCOUNTER — Other Ambulatory Visit (HOSPITAL_COMMUNITY): Payer: Self-pay

## 2021-12-30 DIAGNOSIS — Z720 Tobacco use: Secondary | ICD-10-CM | POA: Diagnosis not present

## 2022-02-09 ENCOUNTER — Other Ambulatory Visit (HOSPITAL_COMMUNITY): Payer: Self-pay

## 2022-02-14 ENCOUNTER — Other Ambulatory Visit: Payer: Self-pay

## 2022-02-14 ENCOUNTER — Other Ambulatory Visit (HOSPITAL_COMMUNITY): Payer: Self-pay

## 2022-03-02 ENCOUNTER — Other Ambulatory Visit: Payer: Self-pay

## 2022-03-02 DIAGNOSIS — Z720 Tobacco use: Secondary | ICD-10-CM | POA: Diagnosis not present

## 2022-03-30 DIAGNOSIS — F4322 Adjustment disorder with anxiety: Secondary | ICD-10-CM | POA: Diagnosis not present

## 2022-04-13 DIAGNOSIS — F4322 Adjustment disorder with anxiety: Secondary | ICD-10-CM | POA: Diagnosis not present

## 2022-04-27 ENCOUNTER — Encounter (HOSPITAL_BASED_OUTPATIENT_CLINIC_OR_DEPARTMENT_OTHER): Payer: Self-pay | Admitting: Obstetrics & Gynecology

## 2022-04-27 DIAGNOSIS — F4322 Adjustment disorder with anxiety: Secondary | ICD-10-CM | POA: Diagnosis not present

## 2022-04-28 ENCOUNTER — Encounter (HOSPITAL_BASED_OUTPATIENT_CLINIC_OR_DEPARTMENT_OTHER): Payer: Self-pay | Admitting: Obstetrics & Gynecology

## 2022-05-11 DIAGNOSIS — F4322 Adjustment disorder with anxiety: Secondary | ICD-10-CM | POA: Diagnosis not present

## 2022-05-16 ENCOUNTER — Other Ambulatory Visit: Payer: Self-pay

## 2022-05-19 ENCOUNTER — Other Ambulatory Visit: Payer: Self-pay

## 2022-05-19 ENCOUNTER — Telehealth (HOSPITAL_BASED_OUTPATIENT_CLINIC_OR_DEPARTMENT_OTHER): Payer: BC Managed Care – PPO | Admitting: Obstetrics & Gynecology

## 2022-05-19 ENCOUNTER — Encounter (HOSPITAL_BASED_OUTPATIENT_CLINIC_OR_DEPARTMENT_OTHER): Payer: Self-pay | Admitting: Obstetrics & Gynecology

## 2022-05-19 DIAGNOSIS — F3281 Premenstrual dysphoric disorder: Secondary | ICD-10-CM | POA: Diagnosis not present

## 2022-05-19 DIAGNOSIS — Z8742 Personal history of other diseases of the female genital tract: Secondary | ICD-10-CM | POA: Diagnosis not present

## 2022-05-19 MED ORDER — ESTRADIOL 0.1 MG/24HR TD PTWK
0.1000 mg | MEDICATED_PATCH | TRANSDERMAL | 1 refills | Status: DC
Start: 2022-05-19 — End: 2022-09-12
  Filled 2022-05-19: qty 4, 28d supply, fill #0
  Filled 2022-08-06: qty 4, 28d supply, fill #1
  Filled 2022-08-11: qty 4, 28d supply, fill #0
  Filled 2022-08-11: qty 4, 28d supply, fill #1

## 2022-05-19 NOTE — Progress Notes (Signed)
Virtual Visit via Video Note  I connected with Kristie Cowman on 05/19/22 at  8:35 AM EDT by a video enabled telemedicine application and verified that I am speaking with the correct person using two identifiers.  Location: Patient: home Provider: office   I discussed the limitations of evaluation and management by telemedicine and the availability of in person appointments. The patient expressed understanding and agreed to proceed.  History of Present Illness: 32 yo G0 SWF with hx of PMDD and dysmenorrhea.  Dysmenorrhea is much improved with nuva ring however she is having PMS/PMDD the week her ring is out.  We have discussed luteal phase dosing of fluoxetine.  She has considered hysterectomy as well.  She would like to discuss risks of surgery which we discussed today including DVT/PE, infection, bowel/bladder/ureteral injury.  She is aware I feel she should keep her ovaries to decrease CVD risk and to improved bone health.  She is not interested in ovary removal if she had a hysterectomy.  We discussed some options for treatment of PMDD including using estradiol only patch during her offic week or luteal phase fluoxetine.  Risks/benefits discussed.  She has stopped smoking for 4 months now.   Observations/Objective: WNWD WF, NAD  Assessment and Plan: 1. PMDD (premenstrual dysphoric disorder) - will try bridging between nuva rings with estradiol patch.  Rx to pharmacy.  Pt will put on patch when removes Nuva ring.  Will do this for 2-3 three months and then give update. - estradiol (CLIMARA) 0.1 mg/24hr patch; Place 1 patch (0.1 mg total) onto the skin once a week.  Dispense: 4 patch; Refill: 1  2. Hx of dysmenorrhea - much improved with nuva ring   Follow Up Instructions: I discussed the assessment and treatment plan with the patient. The patient was provided an opportunity to ask questions and all were answered. The patient agreed with the plan and demonstrated an understanding  of the instructions.   The patient was advised to call back or seek an in-person evaluation if the symptoms worsen or if the condition fails to improve as anticipated.  I provided 26 minutes of non-face-to-face time during this encounter.   Megan Salon, MD

## 2022-05-25 ENCOUNTER — Other Ambulatory Visit: Payer: Self-pay

## 2022-05-25 DIAGNOSIS — F4322 Adjustment disorder with anxiety: Secondary | ICD-10-CM | POA: Diagnosis not present

## 2022-06-13 ENCOUNTER — Encounter (HOSPITAL_BASED_OUTPATIENT_CLINIC_OR_DEPARTMENT_OTHER): Payer: Self-pay | Admitting: Obstetrics & Gynecology

## 2022-06-13 DIAGNOSIS — F4322 Adjustment disorder with anxiety: Secondary | ICD-10-CM | POA: Diagnosis not present

## 2022-07-06 DIAGNOSIS — F4322 Adjustment disorder with anxiety: Secondary | ICD-10-CM | POA: Diagnosis not present

## 2022-07-27 ENCOUNTER — Ambulatory Visit (HOSPITAL_BASED_OUTPATIENT_CLINIC_OR_DEPARTMENT_OTHER): Payer: BC Managed Care – PPO | Admitting: Obstetrics & Gynecology

## 2022-08-07 ENCOUNTER — Other Ambulatory Visit (HOSPITAL_COMMUNITY): Payer: Self-pay

## 2022-08-07 ENCOUNTER — Other Ambulatory Visit: Payer: Self-pay

## 2022-08-08 ENCOUNTER — Other Ambulatory Visit (HOSPITAL_COMMUNITY): Payer: Self-pay

## 2022-08-08 ENCOUNTER — Other Ambulatory Visit: Payer: Self-pay

## 2022-08-10 ENCOUNTER — Other Ambulatory Visit: Payer: Self-pay

## 2022-08-11 ENCOUNTER — Encounter (HOSPITAL_COMMUNITY): Payer: Self-pay

## 2022-08-11 ENCOUNTER — Other Ambulatory Visit (HOSPITAL_BASED_OUTPATIENT_CLINIC_OR_DEPARTMENT_OTHER): Payer: Self-pay

## 2022-08-11 ENCOUNTER — Other Ambulatory Visit (HOSPITAL_COMMUNITY): Payer: Self-pay

## 2022-08-11 ENCOUNTER — Other Ambulatory Visit: Payer: Self-pay

## 2022-08-16 ENCOUNTER — Other Ambulatory Visit: Payer: Self-pay

## 2022-09-07 ENCOUNTER — Ambulatory Visit (HOSPITAL_BASED_OUTPATIENT_CLINIC_OR_DEPARTMENT_OTHER): Payer: BC Managed Care – PPO | Admitting: Obstetrics & Gynecology

## 2022-09-12 ENCOUNTER — Encounter (HOSPITAL_BASED_OUTPATIENT_CLINIC_OR_DEPARTMENT_OTHER): Payer: Self-pay | Admitting: Obstetrics & Gynecology

## 2022-09-12 ENCOUNTER — Ambulatory Visit (HOSPITAL_BASED_OUTPATIENT_CLINIC_OR_DEPARTMENT_OTHER): Payer: 59 | Admitting: Obstetrics & Gynecology

## 2022-09-12 ENCOUNTER — Other Ambulatory Visit (HOSPITAL_BASED_OUTPATIENT_CLINIC_OR_DEPARTMENT_OTHER): Payer: Self-pay

## 2022-09-12 VITALS — BP 117/65 | HR 67 | Ht 70.0 in | Wt 203.4 lb

## 2022-09-12 DIAGNOSIS — F3281 Premenstrual dysphoric disorder: Secondary | ICD-10-CM

## 2022-09-12 DIAGNOSIS — Z8742 Personal history of other diseases of the female genital tract: Secondary | ICD-10-CM

## 2022-09-12 DIAGNOSIS — Z87410 Personal history of cervical dysplasia: Secondary | ICD-10-CM | POA: Diagnosis not present

## 2022-09-12 DIAGNOSIS — Z01419 Encounter for gynecological examination (general) (routine) without abnormal findings: Secondary | ICD-10-CM

## 2022-09-12 MED ORDER — ESTRADIOL 0.1 MG/24HR TD PTTW
MEDICATED_PATCH | TRANSDERMAL | 6 refills | Status: DC
  Filled 2022-09-12: qty 8, 84d supply, fill #0
  Filled 2023-04-22: qty 8, 84d supply, fill #1

## 2022-09-12 NOTE — Patient Instructions (Signed)
Dr. Lutricia Horsfall [place] at [facility]

## 2022-09-12 NOTE — Progress Notes (Signed)
32 y.o. G0P0000 Single White or Caucasian female here for annual exam.  She has been using estradiol patch that is helping with emotional symptoms.  Patch does not stick very well.  Using Nuva ring as well.  Does need RF.    No LMP recorded. (Menstrual status: IUD).          Sexually active: Yes.    The current method of family planning is Nuva ring.    Exercising: yes Smoker:  no  Health Maintenance: Pap:  07/09/2020 Negative History of abnormal Pap:  2015 with CIN1, + HR HPV MMG:  guidelines reviewed Colonoscopy:  guidelines reviewed Screening Labs: plan around age 36   reports that she has quit smoking. Her smoking use included cigarettes. She has never used smokeless tobacco. She reports current alcohol use. She reports current drug use. Drug: Marijuana.  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   LSIL (low grade squamous intraepithelial lesion) on Pap smear 05/2010   OSA (obstructive sleep apnea)    STD (sexually transmitted disease) 09/08/2015   Chlamydia     Past Surgical History:  Procedure Laterality Date   BARTHOLIN CYST MARSUPIALIZATION N/A 05/08/2020   Procedure: BARTHOLIN CYST MARSUPIALIZATION;  Surgeon: Jerene Bears, MD;  Location: [facility] OR;  Service: Gynecology;  Laterality: N/A;   COLPOSCOPY  7/12   CIN I   DIAGNOSTIC LAPAROSCOPY WITH REMOVAL OF ECTOPIC PREGNANCY N/A 10/20/2018   Procedure: DIAGNOSTIC LAPAROSCOPY, LEFT SALPINGECTOMY  WITH REMOVAL OF RUPTURED ECTOPIC PREGNANCY, REMOVAL IUD;  Surgeon: Jerene Bears, MD;  Location: [facility] OR;  Service: Gynecology;  Laterality: N/A;   WISDOM TOOTH EXTRACTION      Current Outpatient Medications  Medication Sig Dispense Refill   [START ON 09/14/2022] estradiol (VIVELLE-DOT) 0.1 MG/24HR patch Place patch 24 hours before removing Nuva ring.  Replace in 4 days.  Repeat monthly 8 patch 6   etonogestrel-ethinyl estradiol (NUVARING) 0.12-0.015  MG/24HR vaginal ring Insert vaginally and leave in place for 3 consecutive weeks, then remove for 1 week. 3 each 4   No current facility-administered medications for this visit.    Family History  Problem Relation Age of Onset   Cancer Maternal Aunt        Lung    ROS: Constitutional: negative Genitourinary:negative  Exam:   BP 117/65 (BP Location: Right Arm, Patient Position: Sitting, Cuff Size: Large)   Pulse 67   Ht 5\' 10"  (1.778 m) Comment: Reported  Wt 203 lb 6.4 oz (92.3 kg)   BMI 29.18 kg/m   Height: 5\' 10"  (177.8 cm) (Reported)  General appearance: alert, cooperative and appears stated age Head: Normocephalic, without obvious abnormality, atraumatic Neck: no adenopathy, supple, symmetrical, trachea midline and thyroid normal to inspection and palpation Lungs: clear to auscultation bilaterally Breasts: normal appearance, no masses or tenderness Heart: regular rate and rhythm Abdomen: soft, non-tender; bowel sounds normal; no masses,  no organomegaly Extremities: extremities normal, atraumatic, no cyanosis or edema Skin: Skin color, texture, turgor normal. No rashes or lesions Lymph nodes: Cervical, supraclavicular, and axillary nodes normal. No abnormal inguinal nodes palpated Neurologic: Grossly normal   Pelvic: External genitalia:  no lesions              Urethra:  normal appearing urethra with no masses, tenderness or lesions              Bartholins and Skenes: normal  Vagina: normal appearing vagina with normal color and no discharge, no lesions              Cervix: no lesions              Pap taken: No. Bimanual Exam:  Uterus:  normal size, contour, position, consistency, mobility, non-tender              Adnexa: normal adnexa and no mass, fullness, tenderness               Rectovaginal: Confirms               Anus:  normal sphincter tone, no lesions  Chaperone, Ina Homes, CMA, was present for exam.  Assessment/Plan: 1. Well woman exam  with routine gynecological exam - Pap smear neg 2022 with neg HR HPV.  Repeat next year. - Mammogram guidelines reviewed - Colonoscopy guidelines reviewed - lab work will be done for screening around age 27 - vaccines reviewed/updated   2. PMDD (premenstrual dysphoric disorder) -will try different patch to see if sticks better for her - estradiol (VIVELLE-DOT) 0.1 MG/24HR patch; Place patch 24 hours before removing Nuva ring.  Replace in 4 days.  Repeat monthly  Dispense: 8 patch; Refill: 6  3. History of cervical dysplasia  4. Hx of dysmenorrhea

## 2023-01-24 ENCOUNTER — Other Ambulatory Visit (HOSPITAL_BASED_OUTPATIENT_CLINIC_OR_DEPARTMENT_OTHER): Payer: Self-pay | Admitting: Obstetrics & Gynecology

## 2023-01-24 ENCOUNTER — Other Ambulatory Visit: Payer: Self-pay | Admitting: Medical Genetics

## 2023-01-24 ENCOUNTER — Other Ambulatory Visit (HOSPITAL_COMMUNITY): Payer: Self-pay

## 2023-01-24 DIAGNOSIS — Z3009 Encounter for other general counseling and advice on contraception: Secondary | ICD-10-CM

## 2023-01-24 MED ORDER — ETONOGESTREL-ETHINYL ESTRADIOL 0.12-0.015 MG/24HR VA RING
VAGINAL_RING | VAGINAL | 2 refills | Status: DC
  Filled 2023-01-24 (×2): qty 3, 84d supply, fill #0
  Filled 2023-04-22: qty 3, 84d supply, fill #1
  Filled 2023-07-11: qty 3, 84d supply, fill #2

## 2023-01-27 ENCOUNTER — Other Ambulatory Visit (HOSPITAL_COMMUNITY): Payer: Self-pay

## 2023-01-31 ENCOUNTER — Other Ambulatory Visit: Payer: Self-pay | Admitting: Medical Genetics

## 2023-03-08 ENCOUNTER — Other Ambulatory Visit (HOSPITAL_COMMUNITY)
Admission: RE | Admit: 2023-03-08 | Discharge: 2023-03-08 | Disposition: A | Discharge status: Home / Self Care | Payer: Self-pay | Source: Ambulatory Visit | Attending: Oncology | Admitting: Oncology

## 2023-03-19 LAB — GENECONNECT MOLECULAR SCREEN: Genetic Analysis Overall Interpretation: NEGATIVE

## 2023-04-23 ENCOUNTER — Other Ambulatory Visit (HOSPITAL_COMMUNITY): Payer: Self-pay

## 2023-04-23 ENCOUNTER — Other Ambulatory Visit: Payer: Self-pay

## 2023-04-24 ENCOUNTER — Other Ambulatory Visit (HOSPITAL_COMMUNITY): Payer: Self-pay

## 2023-05-09 ENCOUNTER — Encounter (HOSPITAL_COMMUNITY): Payer: Self-pay

## 2023-05-09 ENCOUNTER — Ambulatory Visit (HOSPITAL_COMMUNITY)
Admission: EM | Admit: 2023-05-09 | Discharge: 2023-05-09 | Disposition: A | Discharge status: Home / Self Care | Attending: Sports Medicine | Admitting: Sports Medicine

## 2023-05-09 ENCOUNTER — Other Ambulatory Visit: Payer: Self-pay

## 2023-05-09 ENCOUNTER — Other Ambulatory Visit (HOSPITAL_BASED_OUTPATIENT_CLINIC_OR_DEPARTMENT_OTHER): Payer: Self-pay

## 2023-05-09 DIAGNOSIS — L02519 Cutaneous abscess of unspecified hand: Secondary | ICD-10-CM | POA: Diagnosis not present

## 2023-05-09 DIAGNOSIS — Z23 Encounter for immunization: Secondary | ICD-10-CM | POA: Diagnosis not present

## 2023-05-09 DIAGNOSIS — W5501XA Bitten by cat, initial encounter: Secondary | ICD-10-CM | POA: Diagnosis not present

## 2023-05-09 MED ORDER — AMOXICILLIN-POT CLAVULANATE 875-125 MG PO TABS
1.0000 | ORAL_TABLET | Freq: Two times a day (BID) | ORAL | 0 refills | Status: DC
  Filled 2023-05-09: qty 14, 7d supply, fill #0

## 2023-05-09 MED ORDER — OXYCODONE HCL 5 MG PO TABS
5.0000 mg | ORAL_TABLET | Freq: Four times a day (QID) | ORAL | 0 refills | Status: AC | PRN
  Filled 2023-05-09: qty 8, 2d supply, fill #0

## 2023-05-09 MED ORDER — TETANUS-DIPHTH-ACELL PERTUSSIS 5-2.5-18.5 LF-MCG/0.5 IM SUSY
0.5000 mL | PREFILLED_SYRINGE | Freq: Once | INTRAMUSCULAR | Status: AC
  Administered 2023-05-09: 0.5 mL via INTRAMUSCULAR

## 2023-05-09 MED ORDER — TETANUS-DIPHTH-ACELL PERTUSSIS 5-2.5-18.5 LF-MCG/0.5 IM SUSY
PREFILLED_SYRINGE | INTRAMUSCULAR | Status: AC
  Filled 2023-05-09: qty 0.5

## 2023-05-09 NOTE — ED Triage Notes (Addendum)
 Patient states she was bitten by a vaccinated cat yesterday to the left index finger. Patient is a Museum/gallery conservator. Patient states the cat was vaccinated.  Left index finger is swollen and red.  Patient does not know when she had a tetanus last, but believes it is up to date.  Paatient states she took ibuprofen at 0300 today

## 2023-05-09 NOTE — Discharge Instructions (Addendum)
 Wound Care:  Keep the wound clean and dry. Change the dressing daily or more often if it becomes wet or dirty.  Wash your hands thoroughly before and after touching the wound or changing the dressing.  Avoid soaking the finger in water (e.g., no dishwashing, swimming, or bathing without a protective cover). Antibiotic Therapy:  Take the prescribed antibiotic, amoxicillin/clavulanate (Augmentin), as directed for 7 days. Pain Management:  You may take over-the-counter pain relievers such as acetaminophen or ibuprofen as needed for pain control.  I did send you some oxycodone 5mg  for pain to take up to 6 hours apart. Try tylenol and ibuprofen before reaching for this.  Elevate the affected finger to reduce swelling and discomfort. Signs of Infection:  Monitor the wound for signs of worsening infection, including increased redness, swelling, warmth, pain, or discharge.  If you develop a fever or if the wound appears to be getting worse, return for re-evaluation promptly. Tetanus and Rabies Prophylaxis:  We updated your tetanus shot today. No need for rabies ppx given the cats immunization status.

## 2023-05-09 NOTE — ED Provider Notes (Signed)
 [facility]    CSN: [id number] Arrival date & time: 05/09/23  8413      History   Chief Complaint Chief Complaint  Patient presents with   Animal Bite    HPI Monica Bridges is a 33 y.o. female here for evaluation of left index finger swelling and pain after a cat bite yesterday. She also has a pus pocket on the volar surface of the finger. She has been taking ibuprofen, icing, and compressing, but pain still is significant. Swelling limiting her ROM. She works as a Museum/gallery conservator. No previous cat bite and never previously on Augmentin, denies penicillin allergy.   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   LSIL (low grade squamous intraepithelial lesion) on Pap smear 05/2010   OSA (obstructive sleep apnea)    STD (sexually transmitted disease) 09/08/2015   Chlamydia     Patient Active Problem List   Diagnosis Date Noted   Encounter for prescription for nuvaring 11/17/2021   Hx of dysmenorrhea 07/23/2021   Abscess of Bartholin's gland 05/28/2020   History of cervical dysplasia 07/26/2012   Depression     Past Surgical History:  Procedure Laterality Date   BARTHOLIN CYST MARSUPIALIZATION N/A 05/08/2020   Procedure: BARTHOLIN CYST MARSUPIALIZATION;  Surgeon: Jerene Bears, MD;  Location: [facility] OR;  Service: Gynecology;  Laterality: N/A;   COLPOSCOPY  7/12   CIN I   DIAGNOSTIC LAPAROSCOPY WITH REMOVAL OF ECTOPIC PREGNANCY N/A 10/20/2018   Procedure: DIAGNOSTIC LAPAROSCOPY, LEFT SALPINGECTOMY  WITH REMOVAL OF RUPTURED ECTOPIC PREGNANCY, REMOVAL IUD;  Surgeon: Jerene Bears, MD;  Location: [facility] OR;  Service: Gynecology;  Laterality: N/A;   WISDOM TOOTH EXTRACTION      OB History     Gravida  0   Para  0   Term  0   Preterm  0   AB  0   Living  0      SAB  0   IAB  0   Ectopic  0   Multiple  0   Live Births                Home Medications    Prior to Admission medications   Medication Sig Start Date End Date Taking? Authorizing Provider  amoxicillin-clavulanate (AUGMENTIN) 875-125 MG tablet Take 1 tablet by mouth every 12 (twelve) hours. 05/09/23  Yes Marisa Cyphers, MD  oxyCODONE (ROXICODONE) 5 MG immediate release tablet Take 1 tablet (5 mg total) by mouth every 6 (six) hours as needed for up to 3 days for severe pain (pain score 7-10). 05/09/23 05/12/23 Yes Marisa Cyphers, MD  estradiol (VIVELLE-DOT) 0.1 MG/24HR patch Place patch 24 hours before removing Nuva ring.  Replace in 4 days.  Repeat monthly 09/14/22   Jerene Bears, MD  etonogestrel-ethinyl estradiol (NUVARING) 0.12-0.015 MG/24HR vaginal ring Insert vaginally and leave in place for 3 consecutive weeks, then remove for 1 week. 01/24/23   Letta Kocher, CNM    Family History Family History  Problem Relation Age of Onset   Cancer Maternal Aunt        Lung    Social History Social History   Tobacco Use   Smoking status: Former    Current packs/day: 0.25    Types: Cigarettes   Smokeless tobacco: Never   Tobacco comments:  Quit 01/2022  Vaping Use   Vaping status: Some Days   Substances: THC  Substance Use Topics   Alcohol use: Yes    Comment: weekly   Drug use: Yes    Types: Marijuana    Comment: occ     Allergies   Patient has no known allergies.   Review of Systems Review of Systems  Constitutional:  Negative for fever.     Physical Exam Triage Vital Signs ED Triage Vitals  Encounter Vitals Group     BP 05/09/23 1100 130/79     Systolic BP Percentile --      Diastolic BP Percentile --      Pulse Rate 05/09/23 1100 72     Resp 05/09/23 1100 14     Temp 05/09/23 1100 97.7 F (36.5 C)     Temp Source 05/09/23 1100 Oral     SpO2 05/09/23 1100 99 %     Weight --      Height --      Head Circumference --      Peak Flow --      Pain Score 05/09/23 1102 6     Pain Loc --      Pain Education --       Exclude from Growth Chart --    No data found.  Updated Vital Signs BP 130/79 (BP Location: Right Arm)   Pulse 72   Temp 97.7 F (36.5 C) (Oral)   Resp 14   LMP 04/18/2023 (Approximate)   SpO2 99%   Visual Acuity Right Eye Distance:   Left Eye Distance:   Bilateral Distance:    Right Eye Near:   Left Eye Near:    Bilateral Near:     Physical Exam Constitutional:      General: She is not in acute distress.    Appearance: Normal appearance. She is not toxic-appearing.  HENT:     Head: Normocephalic.  Eyes:     Extraocular Movements: Extraocular movements intact.     Pupils: Pupils are equal, round, and reactive to light.  Cardiovascular:     Rate and Rhythm: Normal rate.  Pulmonary:     Effort: Pulmonary effort is normal.  Musculoskeletal:     Cervical back: Normal range of motion and neck supple.     Comments: Left index finger swollen over the proximal phalanx, with extension of the swelling to the middle phalanx and proximally to the palm. Warmth and erythematous. Two puncture wounds noted, on on volar and other on dorsal aspect of the index finger. Small pus pocket noted on the volar aspect right at the bite mark. Finger is tense and TTP. ROM decreased 2/2 swelling. Distally sensation is intact and cap refill is < 2s.  Skin:    Capillary Refill: Capillary refill takes less than 2 seconds.  Neurological:     General: No focal deficit present.     Mental Status: She is alert and oriented to person, place, and time.  Psychiatric:        Mood and Affect: Mood normal.        Behavior: Behavior normal.     Media Information   Document Information  Photos    05/09/2023 11:35  Attached To:  Hospital Encounter on 05/09/23  Source Information  Sage Hammill, [place] Calix, MD  [facility]  Document History      UC Treatments / Results  Labs (all labs ordered are listed, but only abnormal results are displayed) Labs  Reviewed - No data to  display  EKG   Radiology No results found.  Procedures Incision and Drainage  Date/Time: 05/09/2023 11:47 AM  Performed by: Marisa Cyphers, MD Authorized by: Marisa Cyphers, MD   Consent:    Consent obtained:  Verbal   Consent given by:  Patient   Risks, benefits, and alternatives were discussed: yes     Risks discussed:  Bleeding, incomplete drainage, pain and infection   Alternatives discussed:  Alternative treatment Universal protocol:    Procedure explained and questions answered to patient or proxy's satisfaction: yes     Immediately prior to procedure, a time out was called: yes     Patient identity confirmed:  Verbally with patient Location:    Type:  Abscess   Size:  3mm   Location:  Upper extremity   Upper extremity location:  Finger   Finger location:  L index finger Pre-procedure details:    Skin preparation:  Povidone-iodine Sedation:    Sedation type:  None Anesthesia:    Anesthesia method:  None Procedure type:    Complexity:  Simple Procedure details:    Incision types:  Stab incision   Incision depth:  Dermal   Drainage:  Bloody and purulent   Drainage amount:  Scant   Wound treatment:  Wound left open   Packing materials:  None Post-procedure details:    Procedure completion:  Tolerated well, no immediate complications Comments:     Abscess unroofed with 18g needle and allowed to drain.  (including critical care time)  Medications Ordered in UC Medications  Tdap (BOOSTRIX) injection 0.5 mL (has no administration in time range)    Initial Impression / Assessment and Plan / UC Course  I have reviewed the triage vital signs and the nursing notes.  Pertinent labs & imaging results that were available during my care of the patient were reviewed by me and considered in my medical decision making (see chart for details).    Patient is well-appearing, normotensive, afebrile, not tachycardic, not tachypneic, oxygenating well on room air.    Cat bite, initial encounter  Abscess of index finger Overall, vitals and exam are reassuring though does have evidence of cat bite infection with small abscess formation. Abscess drained as above. Inadequate pain control with tylenol and ibuprofen at home, so short course of oxycodone provided to the patient. Will treat with 7d course of Augmentin. Tetanus updated today, last had in 10/2013.  Wound care discussed. Return and ER precautions discussed. Patient's questions were answered and they are in agreement with this plan.  Final Clinical Impressions(s) / UC Diagnoses   Final diagnoses:  Cat bite, initial encounter  Abscess of index finger     Discharge Instructions      Wound Care:  Keep the wound clean and dry. Change the dressing daily or more often if it becomes wet or dirty.  Wash your hands thoroughly before and after touching the wound or changing the dressing.  Avoid soaking the finger in water (e.g., no dishwashing, swimming, or bathing without a protective cover). Antibiotic Therapy:  Take the prescribed antibiotic, amoxicillin/clavulanate (Augmentin), as directed for 7 days. Pain Management:  You may take over-the-counter pain relievers such as acetaminophen or ibuprofen as needed for pain control.  I did send you some oxycodone 5mg  for pain to take up to 6 hours apart. Try tylenol and ibuprofen before reaching for this.  Elevate the affected finger to reduce swelling and discomfort. Signs of Infection:  Monitor the wound for signs of worsening infection, including increased redness, swelling, warmth, pain, or discharge.  If you develop a fever or if the wound appears to be getting worse, return for re-evaluation promptly. Tetanus and Rabies Prophylaxis:  We updated your tetanus shot today. No need for rabies ppx given the cats immunization status.   ED Prescriptions     Medication Sig Dispense Auth. Provider   amoxicillin-clavulanate (AUGMENTIN) 875-125 MG  tablet Take 1 tablet by mouth every 12 (twelve) hours. 14 tablet Marisa Cyphers, MD   oxyCODONE (ROXICODONE) 5 MG immediate release tablet Take 1 tablet (5 mg total) by mouth every 6 (six) hours as needed for up to 3 days for severe pain (pain score 7-10). 8 tablet Marisa Cyphers, MD      I have reviewed the PDMP during this encounter.   Marisa Cyphers, MD 05/09/23 1151

## 2023-05-11 ENCOUNTER — Other Ambulatory Visit (HOSPITAL_COMMUNITY): Payer: Self-pay

## 2023-05-11 MED ORDER — HYDROCODONE-ACETAMINOPHEN 5-325 MG PO TABS
1.0000 | ORAL_TABLET | ORAL | 0 refills | Status: DC | PRN
  Filled 2023-05-11: qty 20, 5d supply, fill #0

## 2023-05-15 ENCOUNTER — Other Ambulatory Visit (HOSPITAL_COMMUNITY): Payer: Self-pay

## 2023-05-15 MED ORDER — HYDROCODONE-ACETAMINOPHEN 5-325 MG PO TABS
1.0000 | ORAL_TABLET | ORAL | 0 refills | Status: DC | PRN
  Filled 2023-05-15: qty 20, 5d supply, fill #0
  Filled 2023-05-16: qty 20, 4d supply, fill #0

## 2023-05-15 MED ORDER — HYDROCODONE-ACETAMINOPHEN 5-325 MG PO TABS
1.0000 | ORAL_TABLET | ORAL | 0 refills | Status: DC | PRN
  Filled 2023-05-15: qty 20, 4d supply, fill #0

## 2023-05-16 ENCOUNTER — Other Ambulatory Visit (HOSPITAL_COMMUNITY): Payer: Self-pay

## 2023-05-29 ENCOUNTER — Other Ambulatory Visit (HOSPITAL_COMMUNITY): Payer: Self-pay

## 2023-05-29 MED ORDER — AMOXICILLIN 875 MG PO TABS
875.0000 mg | ORAL_TABLET | Freq: Two times a day (BID) | ORAL | 0 refills | Status: DC
  Filled 2023-05-29: qty 14, 7d supply, fill #0

## 2023-05-31 ENCOUNTER — Other Ambulatory Visit (HOSPITAL_COMMUNITY): Payer: Self-pay

## 2023-05-31 MED ORDER — HYDROCODONE-ACETAMINOPHEN 5-325 MG PO TABS
1.0000 | ORAL_TABLET | ORAL | 0 refills | Status: DC
  Filled 2023-05-31: qty 20, 5d supply, fill #0

## 2023-06-04 ENCOUNTER — Ambulatory Visit (HOSPITAL_BASED_OUTPATIENT_CLINIC_OR_DEPARTMENT_OTHER)

## 2023-06-04 ENCOUNTER — Other Ambulatory Visit (HOSPITAL_COMMUNITY)
Admission: RE | Admit: 2023-06-04 | Discharge: 2023-06-04 | Disposition: A | Discharge status: Home / Self Care | Source: Ambulatory Visit | Attending: Obstetrics & Gynecology | Admitting: Obstetrics & Gynecology

## 2023-06-04 VITALS — BP 124/72 | HR 67 | Ht 70.0 in | Wt 180.4 lb

## 2023-06-04 DIAGNOSIS — N898 Other specified noninflammatory disorders of vagina: Secondary | ICD-10-CM | POA: Diagnosis present

## 2023-06-04 DIAGNOSIS — B3731 Acute candidiasis of vulva and vagina: Secondary | ICD-10-CM

## 2023-06-04 NOTE — Progress Notes (Unsigned)
 NURSE VISIT- VAGINITIS/STD/POC  SUBJECTIVE:  Monica Bridges is a 33 y.o. G0P0000 GYN patientfemale here for a vaginal swab for vaginitis screening.  She reports the following symptoms: vulvar itching for 2 days . Denies abnormal vaginal bleeding, significant pelvic pain, fever, or UTI symptoms.  OBJECTIVE:  LMP 04/18/2023 (Approximate)   Appears well, in no apparent distress  ASSESSMENT: Vaginal swab for vaginitis screening  PLAN: Self-collected vaginal probe for Bacterial Vaginosis, Yeast sent to lab. Treatment: to be determined once results are received Follow-up as needed if symptoms persist/worsen, or new symptoms develop

## 2023-06-05 ENCOUNTER — Encounter (HOSPITAL_BASED_OUTPATIENT_CLINIC_OR_DEPARTMENT_OTHER): Payer: Self-pay | Admitting: Obstetrics & Gynecology

## 2023-06-05 LAB — CERVICOVAGINAL ANCILLARY ONLY
Bacterial Vaginitis (gardnerella): NEGATIVE
Candida Glabrata: NEGATIVE
Candida Vaginitis: POSITIVE — AB
Comment: NEGATIVE
Comment: NEGATIVE
Comment: NEGATIVE

## 2023-06-05 MED ORDER — FLUCONAZOLE 150 MG PO TABS
150.0000 mg | ORAL_TABLET | Freq: Once | ORAL | 0 refills | Status: AC
  Filled 2023-06-05: qty 2, 2d supply, fill #0

## 2023-06-05 NOTE — Addendum Note (Signed)
 Addended by: Jerene Bears on: 06/05/2023 06:06 PM   Modules accepted: Orders

## 2023-06-06 ENCOUNTER — Other Ambulatory Visit (HOSPITAL_BASED_OUTPATIENT_CLINIC_OR_DEPARTMENT_OTHER): Payer: Self-pay

## 2023-12-13 ENCOUNTER — Ambulatory Visit (HOSPITAL_BASED_OUTPATIENT_CLINIC_OR_DEPARTMENT_OTHER): Admitting: Obstetrics & Gynecology

## 2024-01-03 ENCOUNTER — Ambulatory Visit (INDEPENDENT_AMBULATORY_CARE_PROVIDER_SITE_OTHER): Admitting: Family Medicine

## 2024-01-03 ENCOUNTER — Other Ambulatory Visit: Payer: Self-pay

## 2024-01-03 ENCOUNTER — Ambulatory Visit: Payer: Self-pay | Admitting: Family Medicine

## 2024-01-03 ENCOUNTER — Encounter: Payer: Self-pay | Admitting: Family Medicine

## 2024-01-03 VITALS — BP 112/64 | HR 70 | Temp 97.1°F | Ht 71.0 in | Wt 190.1 lb

## 2024-01-03 DIAGNOSIS — Z23 Encounter for immunization: Secondary | ICD-10-CM

## 2024-01-03 DIAGNOSIS — J351 Hypertrophy of tonsils: Secondary | ICD-10-CM | POA: Diagnosis not present

## 2024-01-03 DIAGNOSIS — G4739 Other sleep apnea: Secondary | ICD-10-CM

## 2024-01-03 DIAGNOSIS — Z0000 Encounter for general adult medical examination without abnormal findings: Secondary | ICD-10-CM

## 2024-01-03 LAB — CBC WITH DIFFERENTIAL/PLATELET
Basophils Absolute: 0 K/uL (ref 0.0–0.1)
Basophils Relative: 0.7 % (ref 0.0–3.0)
Eosinophils Absolute: 0.2 K/uL (ref 0.0–0.7)
Eosinophils Relative: 3.7 % (ref 0.0–5.0)
HCT: 41.7 % (ref 36.0–46.0)
Hemoglobin: 14 g/dL (ref 12.0–15.0)
Lymphocytes Relative: 21.3 % (ref 12.0–46.0)
Lymphs Abs: 1.4 K/uL (ref 0.7–4.0)
MCHC: 33.6 g/dL (ref 30.0–36.0)
MCV: 97 fl (ref 78.0–100.0)
Monocytes Absolute: 0.8 K/uL (ref 0.1–1.0)
Monocytes Relative: 12.5 % — ABNORMAL HIGH (ref 3.0–12.0)
Neutro Abs: 4 K/uL (ref 1.4–7.7)
Neutrophils Relative %: 61.8 % (ref 43.0–77.0)
Platelets: 280 K/uL (ref 150.0–400.0)
RBC: 4.29 Mil/uL (ref 3.87–5.11)
RDW: 13.2 % (ref 11.5–15.5)
WBC: 6.5 K/uL (ref 4.0–10.5)

## 2024-01-03 LAB — COMPREHENSIVE METABOLIC PANEL WITH GFR
ALT: 18 U/L (ref 0–35)
AST: 16 U/L (ref 0–37)
Albumin: 4.6 g/dL (ref 3.5–5.2)
Alkaline Phosphatase: 53 U/L (ref 39–117)
BUN: 16 mg/dL (ref 6–23)
CO2: 28 meq/L (ref 19–32)
Calcium: 9.4 mg/dL (ref 8.4–10.5)
Chloride: 103 meq/L (ref 96–112)
Creatinine, Ser: 0.67 mg/dL (ref 0.40–1.20)
GFR: 113.49 mL/min (ref >60.00)
Glucose, Bld: 94 mg/dL (ref 70–99)
Potassium: 4.9 meq/L (ref 3.5–5.1)
Sodium: 139 meq/L (ref 135–145)
Total Bilirubin: 0.7 mg/dL (ref 0.2–1.2)
Total Protein: 7.3 g/dL (ref 6.0–8.3)

## 2024-01-03 LAB — LIPID PANEL
Cholesterol: 154 mg/dL (ref 0–200)
HDL: 76.6 mg/dL (ref >39.00)
LDL Cholesterol: 69 mg/dL (ref 0–99)
NonHDL: 77.72
Total CHOL/HDL Ratio: 2
Triglycerides: 45 mg/dL (ref 0.0–149.0)
VLDL: 9 mg/dL (ref 0.0–40.0)

## 2024-01-03 LAB — HEMOGLOBIN A1C: Hgb A1c MFr Bld: 4.8 % (ref 4.6–6.5)

## 2024-01-03 LAB — TSH: TSH: 0.65 u[IU]/mL (ref 0.35–5.50)

## 2024-01-03 MED ORDER — ONDANSETRON 8 MG PO TBDP
8.0000 mg | ORAL_TABLET | Freq: Three times a day (TID) | ORAL | 3 refills | Status: AC | PRN
  Filled 2024-01-03: qty 20, 7d supply, fill #0

## 2024-01-03 NOTE — Progress Notes (Signed)
 Labs are great

## 2024-01-03 NOTE — Progress Notes (Signed)
 Phone [phone]   Subjective:   Patient is a 33 y.o. female presenting for annual physical.    Chief Complaint  Patient presents with   Establish Care    Pt is here today to get established    Discussed the use of AI scribe software for clinical note transcription with the patient, who gave verbal consent to proceed.  History of Present Illness Monica Bridges is a 33 year old female who presents for a physical exam and to establish care.  She has a tentative diagnosis of sleep apnea from a sleep study conducted in her early twenties, but further testing was not pursued due to insurance issues. Her partner has observed snoring and episodes of apnea.can wake up sob. She sometimes feels tired during the day, which she attributes to her overnight work schedule. She reports no morning headaches and believes she sleeps well overall.  She has a history of an ectopic pregnancy that resulted in a ruptured fallopian tube, requiring emergency surgery, and a past Bartholin's cyst. Her menstrual cycles are irregular, painful, and heavy, with intense mood swings. She has tried various birth control methods, including NuvaRing , which she discontinued due to dissatisfaction and mood issues. She had an ectopic pregnancy with an IUD in place.  She experiences intermittent nausea, particularly when waking up in the evening after her overnight shifts. She has self-medicated with ondansetron  in the past with good effect and is interested in having an as-needed prescription for this medication.  She reports occasional use of marijuana and alcohol. She consumes more than five drinks on some Saturdays and recognizes this as a self-medication for stress. She smokes marijuana rarely and has been trying to quit smoking cigarettes for years-only does at times after drinking.  She engages in regular physical activity, walking her dogs for 30 minutes to an hour daily and practicing yoga three to four times a  week, although she has recently fallen off her yoga routine.  No major headaches, migraines, dizziness, runny nose, congestion, sore throat, chest pains, heart racing, skipping beats, coughing, wheezing, shortness of breath, vomiting, constipation, diarrhea, or trouble urinating. She reports intermittent softer stools and takes a probiotic. She experiences stress and anxiety, particularly after the loss of her pet, which led her to discontinue NuvaRing . She feels her mood has improved since stopping the birth control, but premenstrual mood symptoms have worsened.     See problem oriented charting- ROS- ROS: Gen: no fever, chills  Skin: no rash, itching ENT: no ear pain, ear drainage, nasal congestion, rhinorrhea, sinus pressure, sore throat Eyes: no blurry vision, double vision Resp: no cough, wheeze,SOB CV: no CP, palpitations, LE edema,  GI: no heartburn, v/d/c, abd pain.  Occ nausea.  Softer stools GU: no dysuria, urgency, frequency, hematuria.  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   LSIL (low grade squamous intraepithelial lesion) on Pap smear 05/2010   OSA (obstructive sleep apnea)    STD (sexually transmitted disease) 09/08/2015   Chlamydia    Patient Active Problem List   Diagnosis Date Noted   Encounter for prescription for nuvaring  11/17/2021   Hx of dysmenorrhea 07/23/2021   Abscess of Bartholin's gland 05/28/2020   History of cervical dysplasia 07/26/2012  Depression    Past Surgical History:  Procedure Laterality Date   BARTHOLIN CYST MARSUPIALIZATION N/A 05/08/2020   Procedure: BARTHOLIN CYST MARSUPIALIZATION;  Surgeon: Cleotilde Ronal RAMAN, MD;  Location: [facility] OR;  Service:  Gynecology;  Laterality: N/A;   COLPOSCOPY  7/12   CIN I   DIAGNOSTIC LAPAROSCOPY WITH REMOVAL OF ECTOPIC PREGNANCY N/A 10/20/2018   Procedure: DIAGNOSTIC LAPAROSCOPY, LEFT SALPINGECTOMY  WITH REMOVAL OF RUPTURED ECTOPIC PREGNANCY, REMOVAL IUD;  Surgeon: Cleotilde Ronal RAMAN, MD;  Location: [facility] OR;  Service: Gynecology;  Laterality: N/A;   WISDOM TOOTH EXTRACTION      Family History  Problem Relation Age of Onset   Cancer Maternal Aunt        Lung   Heart disease Paternal Grandfather 29 - 22    Medications- reviewed and updated Current Outpatient Medications  Medication Sig Dispense Refill   ondansetron  (ZOFRAN -ODT) 8 MG disintegrating tablet Take 1 tablet (8 mg total) by mouth every 8 (eight) hours as needed for nausea or vomiting. 20 tablet 3   No current facility-administered medications for this visit.    Allergies-reviewed and updated No Known Allergies  Social History   Social History Narrative   3-4 energy drinks a week    Objective  Objective:  BP 112/64 (BP Location: Left Arm, Patient Position: Sitting, Cuff Size: Normal)   Pulse 70   Temp (!) 97.1 F (36.2 C) (Temporal)   Ht 5' 11 (1.803 m)   Wt 190 lb 2 oz (86.2 kg)   LMP 12/10/2023 (Approximate)   SpO2 98%   BMI 26.52 kg/m  Physical Exam  Gen: WDWN NAD HEENT: NCAT, conjunctiva not injected, sclera nonicteric TM WNL B, OP moist, no exudates -tonsils large NECK:  supple, no thyromegaly, no nodes, no carotid bruits CARDIAC: RRR, S1S2+, no murmur. DP 2+B LUNGS: CTAB. No wheezes ABDOMEN:  BS+, soft, NTND, No HSM, no masses EXT:  no edema MSK: no gross abnormalities. MS 5/5 all 4 NEURO: A&O x3.  CN II-XII intact.  PSYCH: normal mood. Good eye contact      Assessment and Plan   Health Maintenance counseling: 1. Anticipatory guidance: Patient counseled regarding regular dental exams q6 months, eye exams,  avoiding smoking and second hand smoke, limiting alcohol to 1 beverage per day, no illicit drugs.   2.  Risk factor reduction:  Advised patient of need for regular exercise and diet rich and fruits and vegetables to reduce risk of heart attack and stroke. Exercise- +.  Wt Readings from Last 3 Encounters:  01/03/24 190 lb 2 oz (86.2 kg)  06/05/23 180 lb 6.4 oz (81.8 kg)  09/12/22 203 lb 6.4 oz (92.3 kg)   3. Immunizations/screenings/ancillary studies Immunization History  Administered Date(s) Administered   HPV Quadrivalent 02/27/2005, 08/10/2005, 10/20/2005, 02/16/2006   Hepatitis A, Ped/Adol-2 Dose 08/10/2005, 08/06/2007   Hepatitis B 02/28/2007   Hepatitis B, PED/ADOLESCENT 11/21/1999, 12/26/1999, 05/21/2000   Influenza Split 11/19/2013   Influenza, Seasonal, Injecte, Preservative Fre 01/03/2024   Influenza-Unspecified 02/01/2020   PFIZER(Purple Top)SARS-COV-2 Vaccination 05/10/2019, 06/03/2019, 02/01/2020   Td (Adult) 08/04/2002   Tdap 02/27/2013, 11/19/2013, 05/09/2023   Tetanus 02/27/2006   There are no preventive care reminders to display for this patient.   4. Cervical cancer screening: gyn 5. Skin cancer screening- advised regular sunscreen use. Denies worrisome, changing, or new skin lesions.  6. Birth control/STD check: condoms 7. Smoking associated screening: occ smoker 8. Alcohol screening: done-advised cut back  Wellness examination -     Lipid panel -  Comprehensive metabolic panel with GFR -     CBC with Differential/Platelet -     Hemoglobin A1c -     TSH  Encounter for immunization -     Flu vaccine trivalent PF, 6mos and older(Flulaval,Afluria,Fluarix,Fluzone)  Other sleep apnea -     Ambulatory referral to ENT  Enlarged tonsils -     Ambulatory referral to ENT  Other orders -     Ondansetron ; Take 1 tablet (8 mg total) by mouth every 8 (eight) hours as needed for nausea or vomiting.  Dispense: 20 tablet; Refill: 3    Assessment and Plan Assessment & Plan Adult Wellness Visit   Routine adult wellness visit with no acute concerns. Discussed  general health maintenance, including exercise, diet, and alcohol consumption. Encouraged to maintain a healthy lifestyle and regular follow-up for preventive care. Ordered blood work for routine health maintenance. Encouraged regular exercise and a healthy diet. Advised to limit alcohol consumption to less than two drinks per occasion.  Hypertrophy of tonsils with suspected sleep apnea   Large tonsils may contribute to sleep apnea. Reports snoring and occasional apneic episodes, but no definitive diagnosis of sleep apnea. Discussed potential need for ENT evaluation and possible sleep study. Referred to ENT for evaluation of tonsils and potential sleep apnea. Will discuss with ENT the possibility of a sleep study.  Intermittent nausea   Possibly related to marijuana use or hypoglycemia, with no significant impact on quality of life. Discussed potential correlation with marijuana use and hypoglycemia. Prescribed ondansetron  8 mg dissolving tablets as needed for nausea. Advised to monitor for correlation between marijuana use and nausea. Recommended a high-protein snack before bed to prevent hypoglycemia.  Menstrual irregularity with dysmenorrhea and mood symptoms   Long-standing menstrual irregularity with dysmenorrhea and mood symptoms. Previous use of NuvaRing  discontinued due to side effects. Awaiting gynecologist appointment for further management. Discussed potential need for medication for mood symptoms. Scheduled appointment to discuss mood symptoms and potential medication options.  Stress and anxiety   Experiencing stress and anxiety, possibly exacerbated by recent life events. Discussed potential benefits of therapy and stress management techniques. Encouraged to seek therapy for stress management. Will sch for further eval and tx.     Recommended follow up: Return for soon moods.  Lab/Order associations:not-protein drink on way here fasting   Jenkins CHRISTELLA Carrel, MD

## 2024-01-03 NOTE — Patient Instructions (Signed)
 Welcome to [facility]! It was a pleasure meeting you today.  As discussed, Please schedule a soon month follow up visit today.  PLEASE NOTE:  If you had any LAB tests please let us  know if you have not heard back within a few days. You may see your results on MyChart before we have a chance to review them but we will give you a call once they are reviewed by us . If we ordered any REFERRALS today, please let us  know if you have not heard from their office within the next week.  Let us  know through MyChart if you are needing REFILLS, or have your pharmacy send us  the request. You can also use MyChart to communicate with me or any office staff.  Please try these tips to maintain a healthy lifestyle:  Eat most of your calories during the day when you are active. Eliminate processed foods including packaged sweets (pies, cakes, cookies), reduce intake of potatoes, white bread, white pasta, and white rice. Look for whole grain options, oat flour or almond flour.  Each meal should contain half fruits/vegetables, one quarter protein, and one quarter carbs (no bigger than a computer mouse).  Cut down on sweet beverages. This includes juice, soda, and sweet tea. Also watch fruit intake, though this is a healthier sweet option, it still contains natural sugar! Limit to 3 servings daily.  Drink at least 1 glass of water with each meal and aim for at least 8 glasses per day  Exercise at least 150 minutes every week.

## 2024-01-04 NOTE — Progress Notes (Signed)
 Pt read from MyChart smk

## 2024-01-07 ENCOUNTER — Emergency Department (HOSPITAL_BASED_OUTPATIENT_CLINIC_OR_DEPARTMENT_OTHER)
Admission: EM | Admit: 2024-01-07 | Discharge: 2024-01-07 | Disposition: A | Discharge status: Home / Self Care | Payer: Worker's Compensation | Attending: Emergency Medicine | Admitting: Emergency Medicine

## 2024-01-07 ENCOUNTER — Encounter (HOSPITAL_BASED_OUTPATIENT_CLINIC_OR_DEPARTMENT_OTHER): Payer: Self-pay

## 2024-01-07 DIAGNOSIS — W5501XA Bitten by cat, initial encounter: Secondary | ICD-10-CM | POA: Diagnosis not present

## 2024-01-07 DIAGNOSIS — Y990 Civilian activity done for income or pay: Secondary | ICD-10-CM | POA: Diagnosis not present

## 2024-01-07 DIAGNOSIS — S61251A Open bite of left index finger without damage to nail, initial encounter: Secondary | ICD-10-CM | POA: Insufficient documentation

## 2024-01-07 MED ORDER — AMOXICILLIN-POT CLAVULANATE 875-125 MG PO TABS
1.0000 | ORAL_TABLET | Freq: Two times a day (BID) | ORAL | 0 refills | Status: DC
  Filled 2024-01-07: qty 20, 10d supply, fill #0

## 2024-01-07 MED ORDER — AMOXICILLIN-POT CLAVULANATE 875-125 MG PO TABS
1.0000 | ORAL_TABLET | Freq: Once | ORAL | Status: AC
  Administered 2024-01-07: 1 via ORAL
  Filled 2024-01-07: qty 1

## 2024-01-07 NOTE — ED Provider Notes (Signed)
  [place] EMERGENCY DEPARTMENT AT [facility] Provider Note   CSN: [id number] Arrival date & time: 01/07/24  2319     Patient presents with: Animal Bite   Monica Bridges is a 33 y.o. female.   Patient presents to the emergency department for evaluation of a cat bite.  Patient works at a veterinary clinic, was bitten on the left index finger.  Patient reports that she had a similar bite in the past that got infected and required surgery.  She is concerned about the possibility of infection.  Bites this happened tonight.  All of her vaccinations and the cat's vaccinations are up-to-date.       Prior to Admission medications   Medication Sig Start Date End Date Taking? Authorizing Provider  amoxicillin -clavulanate (AUGMENTIN ) 875-125 MG tablet Take 1 tablet by mouth every 12 (twelve) hours. 01/07/24  Yes Rian Busche, Lonni PARAS, MD  ondansetron  (ZOFRAN -ODT) 8 MG disintegrating tablet Take 1 tablet (8 mg total) by mouth every 8 (eight) hours as needed for nausea or vomiting. 01/03/24   Wendolyn Jenkins Jansky, MD    Allergies: Patient has no known allergies.    Review of Systems  Updated Vital Signs BP 127/71 (BP Location: Right Arm)   Pulse 71   Temp 97.9 F (36.6 C)   Resp 17   Ht 5' 11 (1.803 m)   Wt 86.2 kg   LMP 12/10/2023 (Approximate)   SpO2 100%   BMI 26.50 kg/m   Physical Exam Constitutional:      Appearance: Normal appearance.  Musculoskeletal:     Left hand: Laceration (puncture at base of index finger, palmar aspect) present. No swelling, deformity or tenderness. Normal range of motion. There is no disruption of two-point discrimination. Normal capillary refill.  Skin:    Findings: Wound (puncture wound finger) present.  Neurological:     Mental Status: She is alert.     Cranial Nerves: Cranial nerves 2-12 are intact.     Sensory: Sensation is intact.     Motor: Motor function is intact.     (all labs ordered are listed, but only abnormal  results are displayed) Labs Reviewed - No data to display  EKG: None  Radiology: No results found.   Procedures   Medications Ordered in the ED  amoxicillin -clavulanate (AUGMENTIN ) 875-125 MG per tablet 1 tablet (has no administration in time range)                                    Medical Decision Making Risk Prescription drug management.   Resents with cat bite.  Patient with small puncture on the flexor aspect of the index finger.  Will empirically cover with Augmentin , given return precautions.     Final diagnoses:  Cat bite, initial encounter    ED Discharge Orders          Ordered    amoxicillin -clavulanate (AUGMENTIN ) 875-125 MG tablet  Every 12 hours        01/07/24 2334               Haze Lonni PARAS, MD 01/07/24 2337

## 2024-01-07 NOTE — ED Triage Notes (Signed)
 Cat Bite to left index finger PTA Minimal puncture wound

## 2024-01-08 ENCOUNTER — Other Ambulatory Visit: Payer: Self-pay

## 2024-01-29 ENCOUNTER — Encounter (INDEPENDENT_AMBULATORY_CARE_PROVIDER_SITE_OTHER): Payer: Self-pay | Admitting: Otolaryngology

## 2024-01-29 ENCOUNTER — Ambulatory Visit (INDEPENDENT_AMBULATORY_CARE_PROVIDER_SITE_OTHER): Admitting: Otolaryngology

## 2024-01-29 VITALS — BP 122/77 | HR 54 | Ht 71.0 in | Wt 190.0 lb

## 2024-01-29 DIAGNOSIS — G4730 Sleep apnea, unspecified: Secondary | ICD-10-CM

## 2024-01-29 DIAGNOSIS — J351 Hypertrophy of tonsils: Secondary | ICD-10-CM | POA: Diagnosis not present

## 2024-01-29 NOTE — Progress Notes (Signed)
 Dear Dr. Wendolyn, Here is my assessment for our mutual patient, Monica Bridges. Thank you for allowing me the opportunity to care for your patient. Please do not hesitate to contact me should you have any other questions. Sincerely, Dr. Eldora Blanch  Otolaryngology Clinic Note Referring provider: Dr. Wendolyn HPI:  Monica Bridges is a 33 y.o. female kindly referred by Dr. Wendolyn for evaluation of obstructive sleep apnea.   Initial visit (01/2024): Discussed the use of AI scribe software for clinical note transcription with the patient, who gave verbal consent to proceed.  History of Present Illness Monica Bridges is a 33 year old female who presents with concerns of potential sleep apnea.  Her partner has observed episodes during sleep where she appears to stop breathing. She snores and often feels tired, which she partially attributes to working third shift. She has a deviated septum but does not usually have nasal obstruction, with only occasional congestion and no frequent sinus infections or nasal medication use.  She attempted a sleep study in her mid-twenties but did not complete it, so she has never received a formal diagnosis of sleep apnea. She has no history of tonsil problems or frequent throat infections and denies fevers, night sweats, or weight loss.  No ENT surgery.   H&N Surgery: no Personal or FHx of bleeding dz or anesthesia difficulty: no  GLP-1: no AP/AC: no  Tobacco: prior, quit  Pmhx: GAD, suspected OSA  Independent Review of Additional Tests or Records:  Dr. 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   LSIL (low grade squamous intraepithelial lesion) on Pap smear 05/2010   OSA (obstructive  sleep apnea)    STD (sexually transmitted disease) 09/08/2015   Chlamydia      Past Surgical History:  Procedure Laterality Date   BARTHOLIN CYST MARSUPIALIZATION N/A 05/08/2020   Procedure: BARTHOLIN CYST MARSUPIALIZATION;  Surgeon: Cleotilde Ronal RAMAN, MD;  Location: [facility] OR;  Service: Gynecology;  Laterality: N/A;   COLPOSCOPY  7/12   CIN I   DIAGNOSTIC LAPAROSCOPY WITH REMOVAL OF ECTOPIC PREGNANCY N/A 10/20/2018   Procedure: DIAGNOSTIC LAPAROSCOPY, LEFT SALPINGECTOMY  WITH REMOVAL OF RUPTURED ECTOPIC PREGNANCY, REMOVAL IUD;  Surgeon: Cleotilde Ronal RAMAN, MD;  Location: [facility] OR;  Service: Gynecology;  Laterality: N/A;   WISDOM TOOTH EXTRACTION      Family History  Problem Relation Age of Onset   Cancer Maternal Aunt        Lung   Heart disease Paternal Grandfather 81 - 6     Social Connections: Moderately Isolated (01/01/2024)   Social Connection and Isolation Panel    Frequency of Communication with Friends and Family: Once a week    Frequency of Social Gatherings with Friends and Family: Twice a week    Attends Religious Services: Never    Database Administrator or Organizations: No    Attends Engineer, Structural: Not on file    Marital Status: Living with partner      Current Outpatient Medications:    amoxicillin -clavulanate (AUGMENTIN ) 875-125 MG tablet, Take 1 tablet by mouth every 12 (twelve) hours., Disp: 20 tablet, Rfl: 0   ondansetron  (ZOFRAN -ODT) 8 MG disintegrating tablet, Take 1 tablet (8 mg total) by mouth  every 8 (eight) hours as needed for nausea or vomiting., Disp: 20 tablet, Rfl: 3   Physical Exam:   BP 122/77 (BP Location: Right Arm, Patient Position: Sitting, Cuff Size: Normal)   Pulse (!) 54   Ht 5' 11 (1.803 m)   Wt 190 lb (86.2 kg)   LMP 12/10/2023 (Approximate)   SpO2 97%   BMI 26.50 kg/m   Salient findings:  CN II-XII intact Bilateral EAC clear and TM intact with well pneumatized middle ear spaces Anterior rhinoscopy: Septum intact; bilateral  inferior turbinates without significant hypertrophy No lesions of oral cavity/oropharynx; tonsils 3/3, tongue friedman 3; no significant retrognathia, tonsils normal in appearance No obviously palpable neck masses/lymphadenopathy/thyromegaly No respiratory distress or stridor; TFL was indicated to better evaluate the proximal airway, given the patient's history and exam findings, and is detailed below.  Seprately Identifiable Procedures:  Prior to initiating any procedures, risks/benefits/alternatives were explained to the patient and verbal consent obtained. Procedure Note Pre-procedure diagnosis: Obstructive sleep apnea, rule out structural cause Post-procedure diagnosis: Same Procedure: Transnasal Fiberoptic Laryngoscopy, CPT 31575 - Mod 25 Indication: see above Complications: None apparent EBL: 0 mL  The procedure was undertaken to further evaluate the patient's complaint above, with mirror exam inadequate for appropriate examination due to gag reflex and poor patient tolerance  Procedure:  Patient was identified as correct patient. Verbal consent was obtained. The nose was sprayed with oxymetazoline and 4% lidocaine . The The flexible laryngoscope was passed through the nose to view the nasal cavity, pharynx (oropharynx, hypopharynx) and larynx.  The larynx was examined at rest and during multiple phonatory tasks. Documentation was obtained and reviewed with patient. The scope was removed. The patient tolerated the procedure well.  Findings: The nasal cavity and nasopharynx did not reveal any masses or lesions, mucosa appeared to be without obvious lesions. The tongue base, pharyngeal walls, piriform sinuses, vallecula, epiglottis and postcricoid region are normal in appearance EXCEPT: very large tonsils narrowing OP airway approximately 60-70%. The visualized portion of the subglottis and proximal trachea is widely patent. The vocal folds are mobile bilaterally. There are no lesions on the  free edge of the vocal folds nor elsewhere in the larynx worrisome for malignancy.    Electronically signed by: Eldora KATHEE Blanch, MD 01/29/2024 8:42 AM   Impression & Plans:  Monica Bridges is a 33 y.o. female with:  1. Sleep-disordered breathing   2. Tonsillar hypertrophy    Suspected sleep apnea due to witnessed apneic events and snoring. Tonsillar hypertrophy may contribute to airway obstruction. Discussed sleep study necessity to confirm diagnosis and assess severity. -  - Ordered in-lab sleep study [facility] - Scheduled follow-up phone visit in May to discuss sleep study results.  Tonsil hypertrophy: observe as no other symptoms currently  See below regarding exact medications prescribed this encounter including dosages and route: No orders of the defined types were placed in this encounter.     Thank you for allowing me the opportunity to care for your patient. Please do not hesitate to contact me should you have any other questions.  Sincerely, Eldora Blanch, MD Otolaryngologist (ENT), [facility] Phone: [phone] Fax: [phone]  01/29/2024, 8:42 AM   MDM:  I have personally spent 45 minutes involved in face-to-face and non-face-to-face activities for this patient on the day of the visit.  Professional time spent excludes any procedures performed but includes the following activities, in addition to those noted in the documentation: preparing to see the patient (review of  outside documentation and results), performing a medically appropriate examination, counseling, documenting in the electronic health record

## 2024-02-13 ENCOUNTER — Encounter: Payer: Self-pay | Admitting: Family Medicine

## 2024-02-13 ENCOUNTER — Ambulatory Visit: Admitting: Family Medicine

## 2024-02-13 ENCOUNTER — Other Ambulatory Visit: Payer: Self-pay

## 2024-02-13 VITALS — BP 124/76 | HR 80 | Temp 97.0°F | Ht 71.0 in | Wt 195.4 lb

## 2024-02-13 DIAGNOSIS — F4323 Adjustment disorder with mixed anxiety and depressed mood: Secondary | ICD-10-CM | POA: Diagnosis not present

## 2024-02-13 DIAGNOSIS — N920 Excessive and frequent menstruation with regular cycle: Secondary | ICD-10-CM

## 2024-02-13 MED ORDER — NORETHINDRONE ACET-ETHINYL EST 1-20 MG-MCG PO TABS
1.0000 | ORAL_TABLET | Freq: Every day | ORAL | 2 refills | Status: AC
  Filled 2024-02-13: qty 21, 21d supply, fill #0
  Filled 2024-03-01: qty 21, 21d supply, fill #1
  Filled 2024-03-24: qty 21, 21d supply, fill #2

## 2024-02-13 NOTE — Patient Instructions (Signed)
 Happy Holidays!!!!

## 2024-02-13 NOTE — Progress Notes (Signed)
 Subjective:     Patient ID: Monica Bridges, female    DOB: 26-Aug-1990, 33 y.o.   MRN: [id number]  Chief Complaint  Patient presents with   Anxiety    Pt is here for follow up on mood    Discussed the use of AI scribe software for clinical note transcription with the patient, who gave verbal consent to proceed.  History of Present Illness Monica Bridges is a 33 year old female who presents with mood concerns and premenstrual symptoms.  She experiences significant premenstrual symptoms, including nausea, cramps, headaches, and mental fog, which become particularly severe one to two days before menstruation. These symptoms improve once menstruation begins, although cramps persist but are less debilitating. Over-the-counter ibuprofen  is no longer effective for her cramps, and ondansetron  has been helpful for nausea.  She has a history of using various forms of birth control to manage her symptoms. Initially, she used the pill but had adherence issues due to her age at the time. She later used the NuvaRing , which she liked but experienced weight gain and decreased libido. She also used an IUD (Kyleena ), which she liked but had an ectopic pregnancy, leading her gynocologist to advise against its use. She tried Nexplanon  but found it increased her emotional volatility. She is considering returning to the pill, as she feels more capable of adhering to a daily regimen now. She wants a cost-effective solution to manage her symptoms and prevent pregnancy. She has also considered a hysterectomy but is hesitant due to its permanence and potential limitations in addressing her symptoms.  She experiences heavy menstrual bleeding for two to four days. Her periods are regular, occurring every four and a half to five weeks. LMP just finishing.  Seeing gyn in Jan  She has a history of depression and anxiety, with mixed symptoms. She has experienced suicidal thoughts earlier in the year but without a plan.  She previously took Lexapro and possibly Wellbutrin in high school. She is interested in pursuing talk therapy and is open to medication if necessary. She engages in daily physical activity, including walking her dogs and yoga, and uses a SAD lamp to manage seasonal mood changes.  She works overnight shifts and has quit smoking. No smoking and no migraines with auras.    There are no preventive care reminders to display for this patient.  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   LSIL (low grade squamous intraepithelial lesion) on Pap smear 05/2010   OSA (obstructive sleep apnea)    STD (sexually transmitted disease) 09/08/2015   Chlamydia     Past Surgical History:  Procedure Laterality Date   BARTHOLIN CYST MARSUPIALIZATION N/A 05/08/2020   Procedure: BARTHOLIN CYST MARSUPIALIZATION;  Surgeon: Cleotilde Ronal RAMAN, MD;  Location: [facility] OR;  Service: Gynecology;  Laterality: N/A;   COLPOSCOPY  7/12   CIN I   DIAGNOSTIC LAPAROSCOPY WITH REMOVAL OF ECTOPIC PREGNANCY N/A 10/20/2018   Procedure: DIAGNOSTIC LAPAROSCOPY, LEFT SALPINGECTOMY  WITH REMOVAL OF RUPTURED ECTOPIC PREGNANCY, REMOVAL IUD;  Surgeon: Cleotilde Ronal RAMAN, MD;  Location: [facility] OR;  Service: Gynecology;  Laterality: N/A;   WISDOM TOOTH EXTRACTION      Current Medications[1]  Allergies[2] ROS neg/noncontributory except as noted HPI/below      Objective:     BP 124/76 (BP Location: Left Arm, Patient Position: Sitting)   Pulse 80   Temp (!) 97 F (  36.1 C) (Temporal)   Ht 5' 11 (1.803 m)   Wt 195 lb 6 oz (88.6 kg)   LMP 02/08/2024 (Exact Date)   SpO2 98%   BMI 27.25 kg/m  Wt Readings from Last 3 Encounters:  02/13/24 195 lb 6 oz (88.6 kg)  01/29/24 190 lb (86.2 kg)  01/07/24 190 lb (86.2 kg)    Physical Exam GENERAL: Well developed well nourished no acute distress HEAD EYES EARS NOSE THROAT: Normocephalic atraumatic, conjunctiva not  injected, sclera nonicteric MUSCULOSKELETAL: No gross abnormalities NEUROLOGICAL: Alert and oriented x3, cranial nerves II through XII intact PSYCHIATRIC: Normal mood, good eye contact       Assessment & Plan:  Menorrhagia with regular cycle  Adjustment disorder with mixed anxiety and depressed mood -     Ambulatory referral to Behavioral Health  Other orders -     Norethindrone  Acet-Ethinyl Est; Take 1 tablet by mouth daily.  Dispense: 28 tablet; Refill: 2    Assessment and Plan Assessment & Plan Menorrhagia with regular cycle   She experiences menorrhagia with regular cycles, marked by heavy bleeding for 2-4 days every 4.5 to 5 weeks, accompanied by severe cramps, nausea, headache, and emotional dysphoria premenstrually, which improve once menstruation begins. Previous use of NuvaRing  and Nexplanon  was not well-tolerated due to weight gain, decreased libido, and emotional volatility. An IUD was effective but contraindicated due to ectopic pregnancy. Birth control pills are considered a more affordable and manageable option. A potential hysterectomy was previously discussed as a permanent solution, but she is hesitant due to its permanence and potential limitations in addressing emotional symptoms. Prescribed Loestrin  birth control pills and advised using backup contraception for 7 days after starting the pill. Monitor symptoms and effectiveness of the birth control pills. Discuss the potential for Prozac during the week of menstruation if emotional symptoms persist.f/u gyn as sch next month  Adjustment disorder with mixed anxiety and depressed mood   She presents with mixed anxiety and depressive symptoms, including recent thoughts of suicide but no current plan. Previous treatment with Lexapro and possibly Wellbutrin in high school. Prefers non-pharmacological interventions initially, such as talk therapy and lifestyle modifications. Discussed the potential benefits of counseling and  alternative therapies like meditation and exercise. Medication will be considered if symptoms do not improve with non-pharmacological interventions. Referred to behavioral health for counseling and encouraged regular exercise and meditation. Advised to monitor mood fluctuations in relation to the menstrual cycle. Discussed potential use of Prozac during the week of menstruation if needed.     Return if symptoms worsen or fail to improve.  Jenkins CHRISTELLA Carrel, MD     [1]  Current Outpatient Medications:    norethindrone -ethinyl estradiol  (LOESTRIN  1/20, 21,) 1-20 MG-MCG tablet, Take 1 tablet by mouth daily., Disp: 28 tablet, Rfl: 2   ondansetron  (ZOFRAN -ODT) 8 MG disintegrating tablet, Take 1 tablet (8 mg total) by mouth every 8 (eight) hours as needed for nausea or vomiting., Disp: 20 tablet, Rfl: 3 [2] No Known Allergies

## 2024-02-14 ENCOUNTER — Other Ambulatory Visit: Payer: Self-pay

## 2024-03-05 NOTE — Progress Notes (Signed)
 "  ANNUAL EXAM Patient name: Monica Bridges MRN [id number]  Date of birth: [birth year] Chief Complaint:   No chief complaint on file.  History of Present Illness:   Monica Bridges is a 34 y.o. G0P0000 Caucasian female being seen today for a routine annual exam.  She switched from Nuva ring to combination OCPs in November.  Was having increased bleeding and was having some libido changes.  Feeling ok on the pill but this is just the end of week 3.  Should start cycle at any time.    She has been having some seep issues.  Has a sleep issues years ago.  She is planning for a sleep study this year.    Depression screening positive today but she feels sleep and energy issues are contributing.  She has discussed this as well with Dr Wendolyn.  She is working on establishing care again with a therapist.   H/o bartholin's abscess.  She does have some issues after intercourse that seem to resolve within a 48 hours.    Patient's last menstrual period was 02/08/2024 (exact date).   Last pap 07/09/2020. Results were: NILM w/ HRHPV negative. H/O abnormal pap: yes Last mammogram: Family h/o breast cancer: yes paternal grandmother.  Guidelines for screening discussed.   Last colonoscopy: Family h/o colorectal cancer: no.  Guidelines for screening discussed.      03/06/2024    2:57 PM 09/12/2022    9:59 AM 11/15/2021    9:19 AM 07/21/2021    3:59 PM 07/09/2020   10:27 AM  Depression screen PHQ 2/9  Decreased Interest 2 0 0 0 1  Down, Depressed, Hopeless 2 0 0 0 0  PHQ - 2 Score 4 0 0 0 1  Altered sleeping 1      Tired, decreased energy 2      Change in appetite 2      Feeling bad or failure about yourself  2      Trouble concentrating 1      Moving slowly or fidgety/restless 0      Suicidal thoughts 0      PHQ-9 Score 12      Difficult doing work/chores Somewhat difficult        Review of Systems:   Pertinent items are noted in HPI Denies any urinary or bowel changes.  Denies pelvic  pain. Pertinent History Reviewed:  Reviewed past medical,surgical, social and family history.  Reviewed problem list, medications and allergies. Physical Assessment:   Vitals:   03/06/24 1452  BP: 115/67  Pulse: 67  SpO2: 100%  Weight: 196 lb (88.9 kg)  Height: 5' 11 (1.803 m)  Body mass index is 27.34 kg/m.        Physical Examination:   General appearance - well appearing, and in no distress  Mental status - alert, oriented to person, place, and time  Psych:  She has a normal mood and affect  Skin - warm and dry, normal color, no suspicious lesions noted  Chest - effort normal, all lung fields clear to auscultation bilaterally  Heart - normal rate and regular rhythm  Neck:  midline trachea, no thyromegaly or nodules  Breasts - breasts appear normal, no suspicious masses, no skin or nipple changes or  axillary nodes  Abdomen - soft, nontender, nondistended, no masses or organomegaly  Pelvic - VULVA: normal appearing vulva with no masses, tenderness or lesions   VAGINA: normal appearing vagina with normal color and discharge, no lesions  CERVIX: normal appearing cervix without discharge or lesions, no CMT  Thin prep pap is updated with HR HPV  UTERUS: uterus is felt to be normal size, shape, consistency and nontender   ADNEXA: No adnexal masses or tenderness noted.  Rectal - normal rectal, good sphincter tone, no masses felt  Extremities:  No swelling or varicosities noted  Chaperone present for exam  No results found for this or any previous visit (from the past 24 hours).  Assessment & Plan:  1. Well woman exam with routine gynecological exam (Primary) - Pap smear and high risk HPV - Mammogram guidelines reviewed - Colonoscopy guidelines reviewed - lab work done with PCP, Dr. Wendolyn - vaccines reviewed/updated  2. Cervical cancer screening - Cytology - PAP( [place])  3. History of cervical dysplasia  4. History of ectopic pregnancy  5. H/o abscess of  Bartholin's gland   No orders of the defined types were placed in this encounter.   Meds: No orders of the defined types were placed in this encounter.   Follow-up: Return in about 1 year (around 03/06/2025).  Ronal GORMAN Pinal, MD 03/06/2024 3:18 PM "

## 2024-03-06 ENCOUNTER — Ambulatory Visit (INDEPENDENT_AMBULATORY_CARE_PROVIDER_SITE_OTHER): Admitting: Obstetrics & Gynecology

## 2024-03-06 ENCOUNTER — Other Ambulatory Visit (HOSPITAL_COMMUNITY)
Admission: RE | Admit: 2024-03-06 | Discharge: 2024-03-06 | Disposition: A | Source: Ambulatory Visit | Attending: Obstetrics & Gynecology | Admitting: Obstetrics & Gynecology

## 2024-03-06 ENCOUNTER — Encounter (HOSPITAL_BASED_OUTPATIENT_CLINIC_OR_DEPARTMENT_OTHER): Payer: Self-pay | Admitting: Obstetrics & Gynecology

## 2024-03-06 VITALS — BP 115/67 | HR 67 | Ht 71.0 in | Wt 196.0 lb

## 2024-03-06 DIAGNOSIS — Z87410 Personal history of cervical dysplasia: Secondary | ICD-10-CM | POA: Diagnosis not present

## 2024-03-06 DIAGNOSIS — Z8759 Personal history of other complications of pregnancy, childbirth and the puerperium: Secondary | ICD-10-CM | POA: Insufficient documentation

## 2024-03-06 DIAGNOSIS — Z01419 Encounter for gynecological examination (general) (routine) without abnormal findings: Secondary | ICD-10-CM

## 2024-03-06 DIAGNOSIS — Z124 Encounter for screening for malignant neoplasm of cervix: Secondary | ICD-10-CM

## 2024-03-06 DIAGNOSIS — Z1331 Encounter for screening for depression: Secondary | ICD-10-CM

## 2024-03-06 DIAGNOSIS — Z1151 Encounter for screening for human papillomavirus (HPV): Secondary | ICD-10-CM | POA: Diagnosis not present

## 2024-03-06 DIAGNOSIS — N751 Abscess of Bartholin's gland: Secondary | ICD-10-CM

## 2024-03-10 LAB — CYTOLOGY - PAP
Comment: NEGATIVE
Diagnosis: NEGATIVE
High risk HPV: NEGATIVE

## 2024-03-11 ENCOUNTER — Ambulatory Visit (HOSPITAL_BASED_OUTPATIENT_CLINIC_OR_DEPARTMENT_OTHER): Admitting: Obstetrics & Gynecology

## 2024-03-12 ENCOUNTER — Ambulatory Visit (HOSPITAL_BASED_OUTPATIENT_CLINIC_OR_DEPARTMENT_OTHER): Payer: Self-pay | Admitting: Obstetrics & Gynecology

## 2024-03-24 ENCOUNTER — Other Ambulatory Visit: Payer: Self-pay

## 2024-03-24 ENCOUNTER — Other Ambulatory Visit (HOSPITAL_COMMUNITY): Payer: Self-pay

## 2024-07-03 ENCOUNTER — Ambulatory Visit (INDEPENDENT_AMBULATORY_CARE_PROVIDER_SITE_OTHER): Admitting: Otolaryngology
